# Patient Record
Sex: Female | Born: 1965 | Race: White | Hispanic: No | Marital: Married | Smoking: Never smoker
Health system: Southern US, Community
[De-identification: ages and names within clinical notes are randomized; demographics above are authoritative.]

## PROBLEM LIST (undated history)

## (undated) DIAGNOSIS — F32A Depression, unspecified: Secondary | ICD-10-CM

## (undated) DIAGNOSIS — F329 Major depressive disorder, single episode, unspecified: Secondary | ICD-10-CM

## (undated) HISTORY — PX: CARPAL TUNNEL RELEASE: SHX101

---

## 2002-05-31 ENCOUNTER — Emergency Department (HOSPITAL_COMMUNITY): Admission: EM | Admit: 2002-05-31 | Discharge: 2002-06-01 | Payer: Self-pay | Admitting: Emergency Medicine

## 2002-06-01 ENCOUNTER — Encounter: Payer: Self-pay | Admitting: Emergency Medicine

## 2004-10-19 ENCOUNTER — Emergency Department (HOSPITAL_COMMUNITY): Admission: EM | Admit: 2004-10-19 | Discharge: 2004-10-19 | Payer: Self-pay | Admitting: Family Medicine

## 2004-11-21 ENCOUNTER — Emergency Department (HOSPITAL_COMMUNITY): Admission: EM | Admit: 2004-11-21 | Discharge: 2004-11-21 | Payer: Self-pay | Admitting: Emergency Medicine

## 2005-04-06 ENCOUNTER — Encounter: Admission: RE | Admit: 2005-04-06 | Discharge: 2005-04-06 | Payer: Self-pay | Admitting: Family Medicine

## 2005-04-10 ENCOUNTER — Encounter: Admission: RE | Admit: 2005-04-10 | Discharge: 2005-04-10 | Payer: Self-pay | Admitting: Family Medicine

## 2005-04-13 ENCOUNTER — Encounter: Admission: RE | Admit: 2005-04-13 | Discharge: 2005-04-13 | Payer: Self-pay | Admitting: Family Medicine

## 2005-06-22 ENCOUNTER — Emergency Department (HOSPITAL_COMMUNITY): Admission: EM | Admit: 2005-06-22 | Discharge: 2005-06-23 | Payer: Self-pay | Admitting: Emergency Medicine

## 2005-10-24 ENCOUNTER — Emergency Department (HOSPITAL_COMMUNITY): Admission: EM | Admit: 2005-10-24 | Discharge: 2005-10-24 | Payer: Self-pay | Admitting: Family Medicine

## 2006-08-15 ENCOUNTER — Emergency Department (HOSPITAL_COMMUNITY): Admission: EM | Admit: 2006-08-15 | Discharge: 2006-08-15 | Payer: Self-pay | Admitting: Emergency Medicine

## 2006-12-17 ENCOUNTER — Other Ambulatory Visit: Admission: RE | Admit: 2006-12-17 | Discharge: 2006-12-17 | Payer: Self-pay | Admitting: Obstetrics and Gynecology

## 2006-12-17 ENCOUNTER — Ambulatory Visit (HOSPITAL_COMMUNITY): Admission: RE | Admit: 2006-12-17 | Discharge: 2006-12-17 | Payer: Self-pay | Admitting: Obstetrics and Gynecology

## 2007-02-17 ENCOUNTER — Ambulatory Visit (HOSPITAL_COMMUNITY): Admission: RE | Admit: 2007-02-17 | Discharge: 2007-02-17 | Payer: Self-pay | Admitting: Obstetrics and Gynecology

## 2007-02-28 ENCOUNTER — Ambulatory Visit (HOSPITAL_COMMUNITY): Admission: RE | Admit: 2007-02-28 | Discharge: 2007-02-28 | Payer: Self-pay | Admitting: Obstetrics and Gynecology

## 2007-03-19 ENCOUNTER — Ambulatory Visit (HOSPITAL_COMMUNITY): Admission: RE | Admit: 2007-03-19 | Discharge: 2007-03-19 | Payer: Self-pay | Admitting: Obstetrics and Gynecology

## 2007-04-16 ENCOUNTER — Ambulatory Visit (HOSPITAL_COMMUNITY): Admission: RE | Admit: 2007-04-16 | Discharge: 2007-04-16 | Payer: Self-pay | Admitting: Obstetrics and Gynecology

## 2007-05-07 ENCOUNTER — Inpatient Hospital Stay (HOSPITAL_COMMUNITY): Admission: AD | Admit: 2007-05-07 | Discharge: 2007-05-07 | Payer: Self-pay | Admitting: Obstetrics and Gynecology

## 2007-05-14 ENCOUNTER — Ambulatory Visit (HOSPITAL_COMMUNITY): Admission: RE | Admit: 2007-05-14 | Discharge: 2007-05-14 | Payer: Self-pay | Admitting: Obstetrics and Gynecology

## 2007-06-05 ENCOUNTER — Inpatient Hospital Stay (HOSPITAL_COMMUNITY): Admission: AD | Admit: 2007-06-05 | Discharge: 2007-06-05 | Payer: Self-pay | Admitting: Obstetrics and Gynecology

## 2007-06-11 ENCOUNTER — Ambulatory Visit (HOSPITAL_COMMUNITY): Admission: RE | Admit: 2007-06-11 | Discharge: 2007-06-11 | Payer: Self-pay | Admitting: Obstetrics and Gynecology

## 2007-07-13 ENCOUNTER — Inpatient Hospital Stay (HOSPITAL_COMMUNITY): Admission: AD | Admit: 2007-07-13 | Discharge: 2007-07-19 | Payer: Self-pay | Admitting: Obstetrics and Gynecology

## 2007-07-15 ENCOUNTER — Encounter (INDEPENDENT_AMBULATORY_CARE_PROVIDER_SITE_OTHER): Payer: Self-pay | Admitting: Obstetrics and Gynecology

## 2008-11-18 ENCOUNTER — Other Ambulatory Visit: Admission: RE | Admit: 2008-11-18 | Discharge: 2008-11-18 | Payer: Self-pay | Admitting: Obstetrics and Gynecology

## 2009-02-15 IMAGING — US US OB DETAIL+14 WK
1 series · 14 of 28 positions shown · non-contrast
Comparison: none

OBSTETRICAL ULTRASOUND:
 This ultrasound was performed in The [HOSPITAL], and the [facility] report will be stored to [REDACTED] PACS.

[Series 1: us ob detail+14 wk · 14 of 54 slices shown]
[im 2/54]
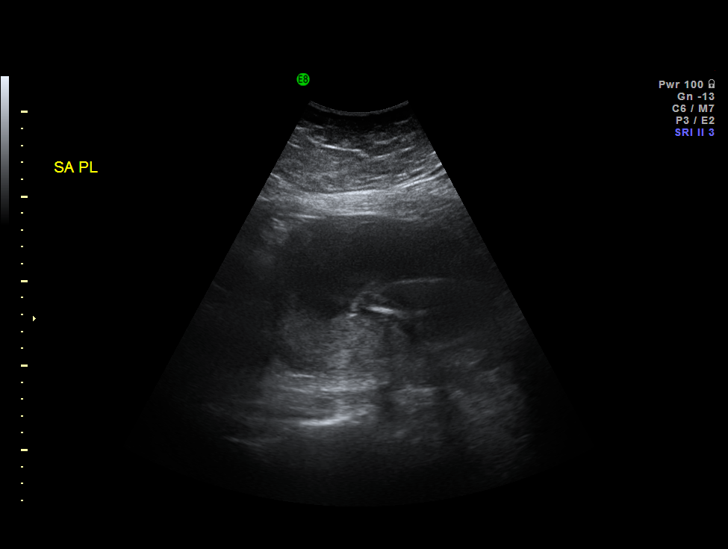
[im 6/54]
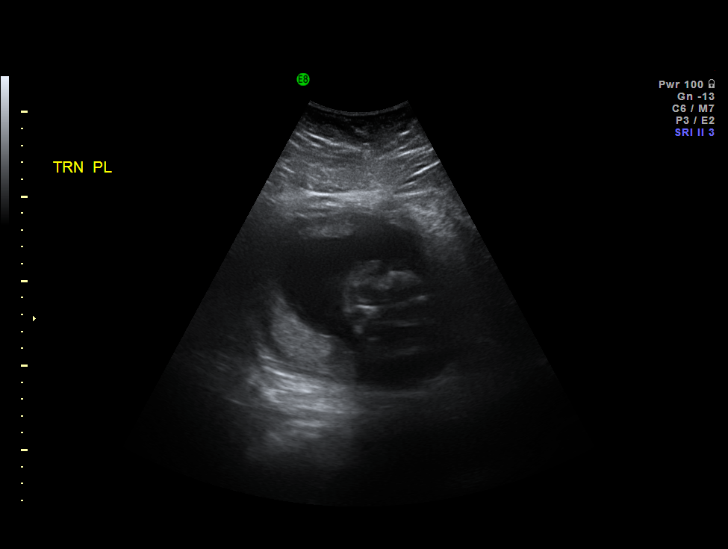
[im 10/54]
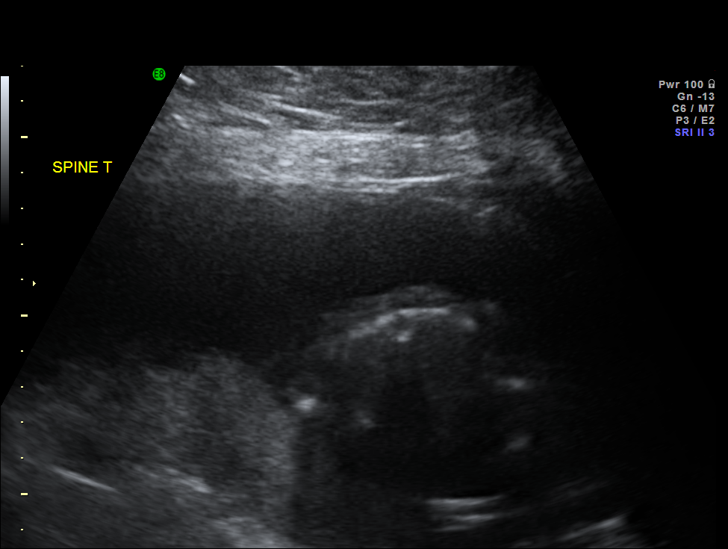
[im 14/54]
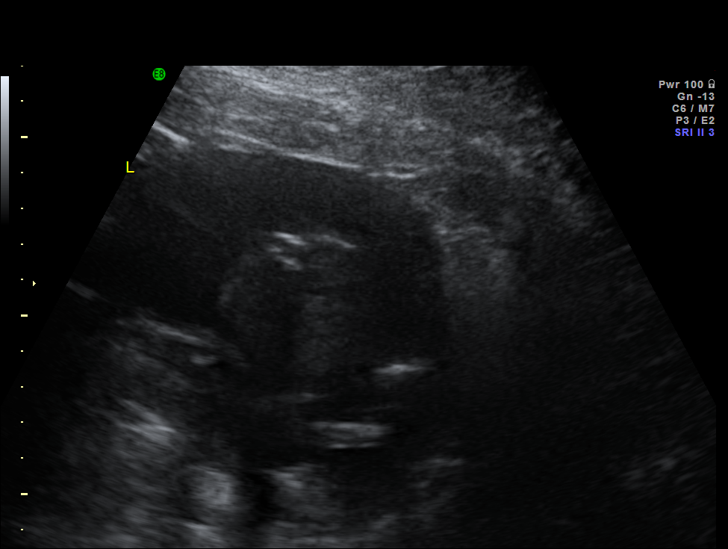
[im 18/54]
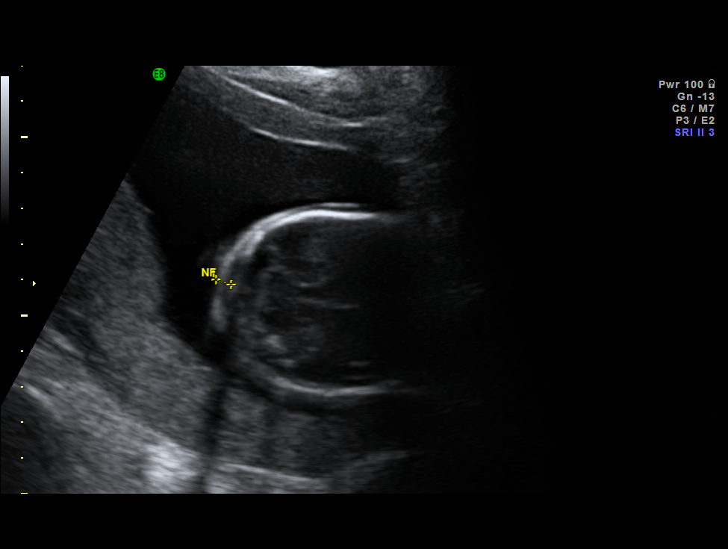
[im 22/54]
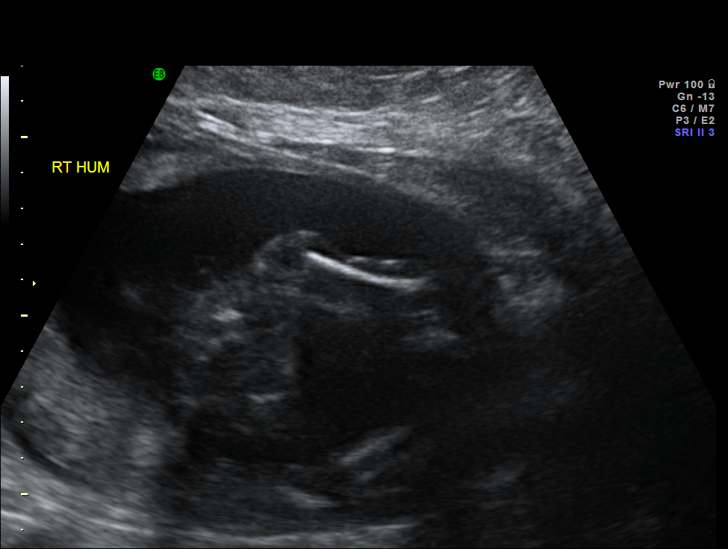
[im 26/54]
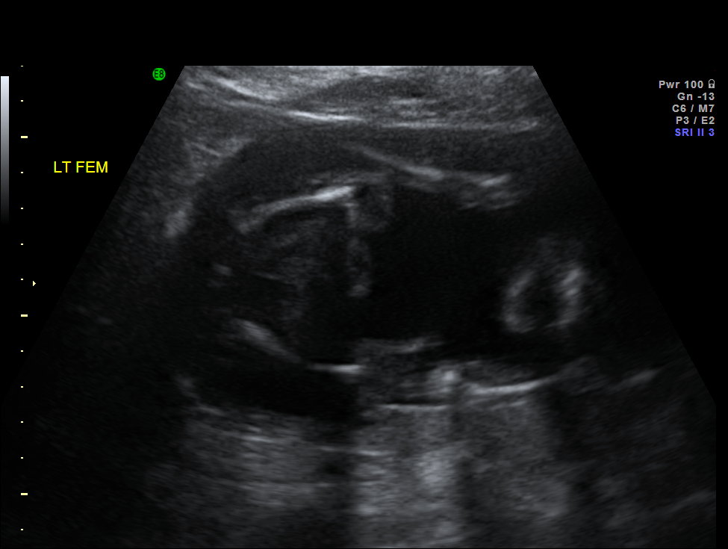
[im 30/54]
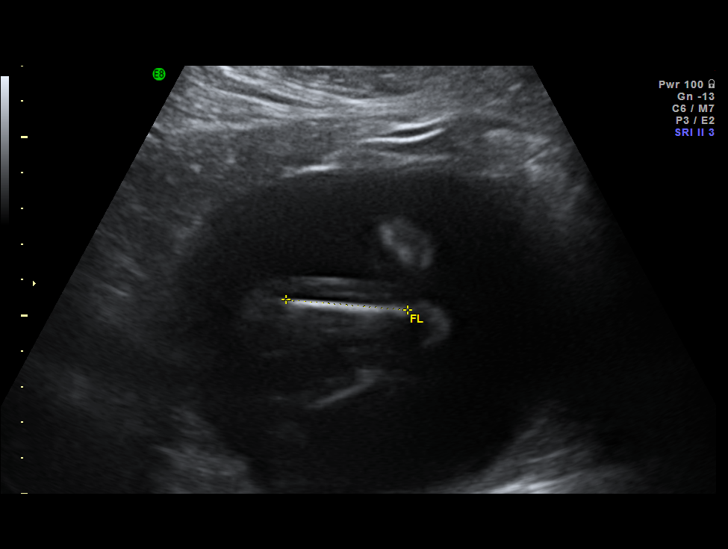
[im 34/54]
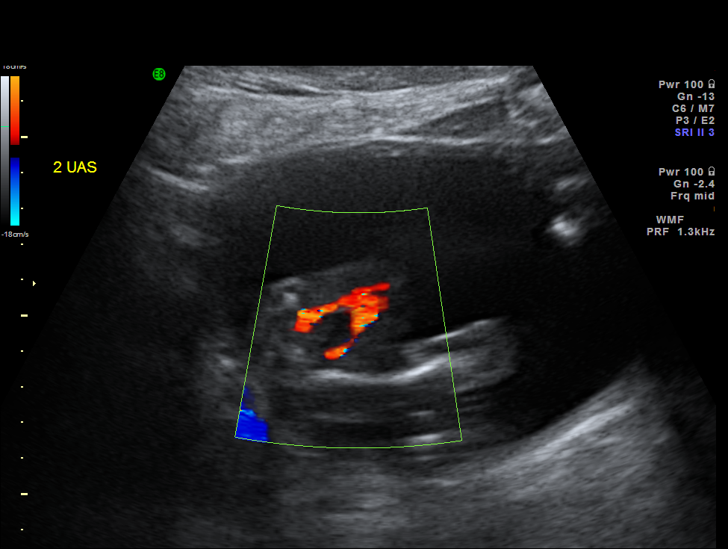
[im 38/54]
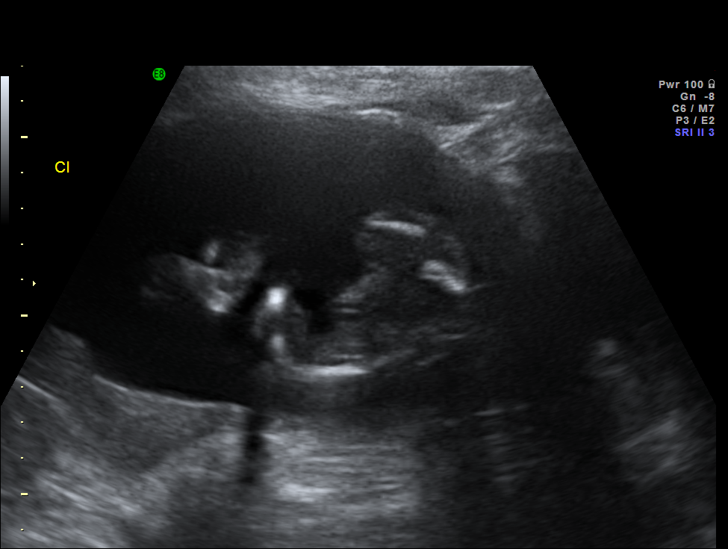
[im 42/54]
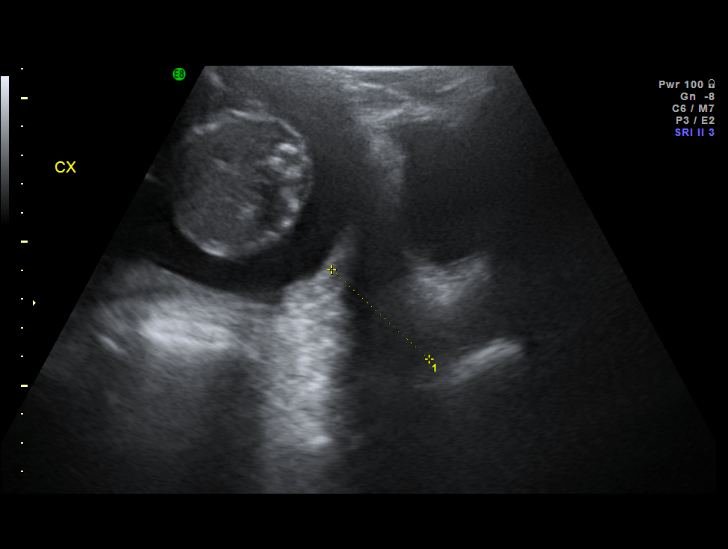
[im 46/54]
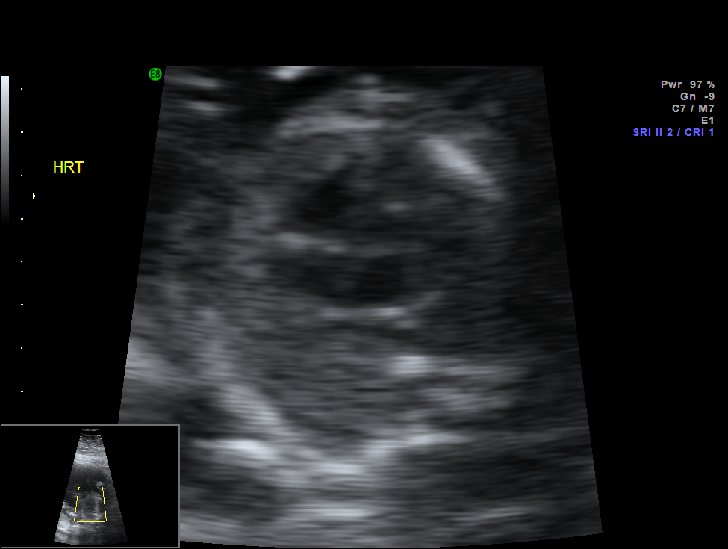
[im 50/54]
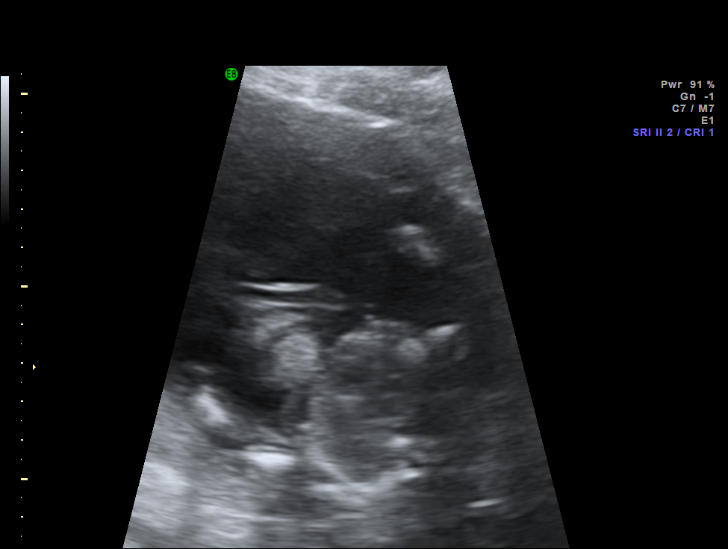
[im 54/54]
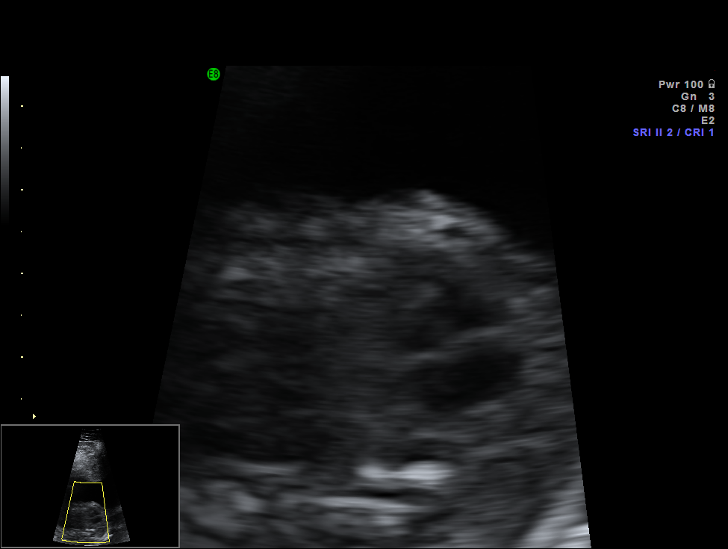

[14 of 28 positions shown; findings below may reference images not displayed]

IMPRESSION: The [facility] report has also been faxed to the ordering physician.

## 2009-05-12 IMAGING — US US OB FOLLOW-UP
1 series · 14 of 26 positions shown · non-contrast
Comparison: none

OBSTETRICAL ULTRASOUND:
 This ultrasound was performed in The [HOSP[facility]], and the [facility] report will be stored to [REDACTED] PACS.

[Series 1: us ob follow-up · 14 of 26 slices shown]
[im 1/26]
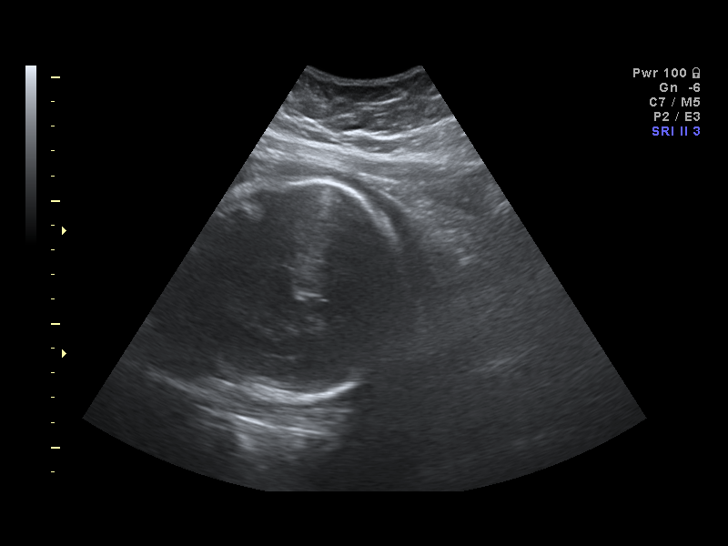
[im 3/26]
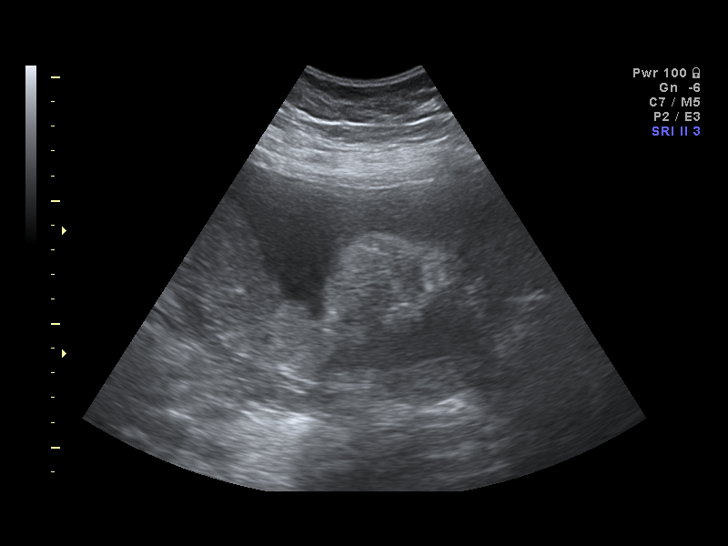
[im 5/26]
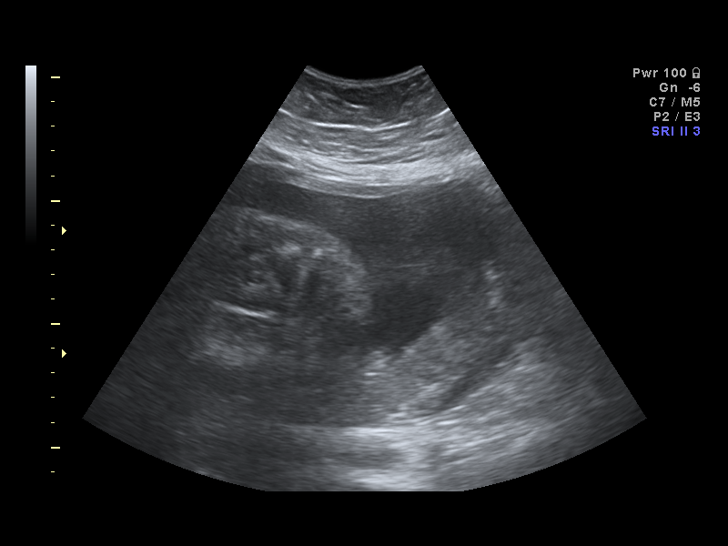
[im 7/26]
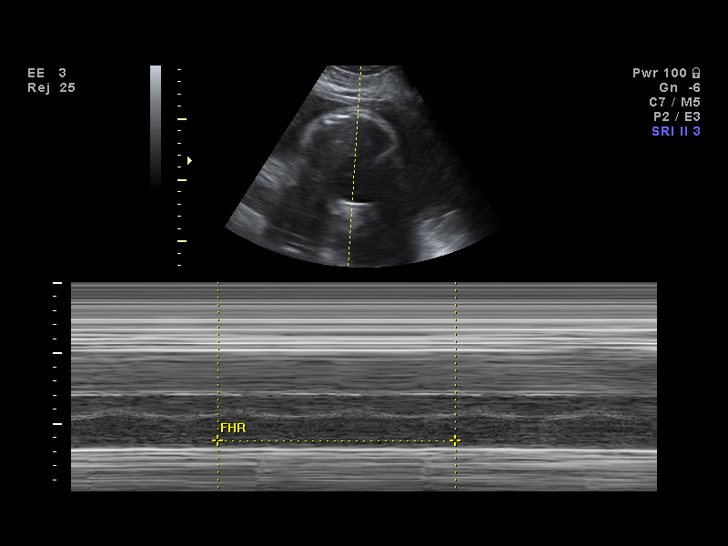
[im 9/26]
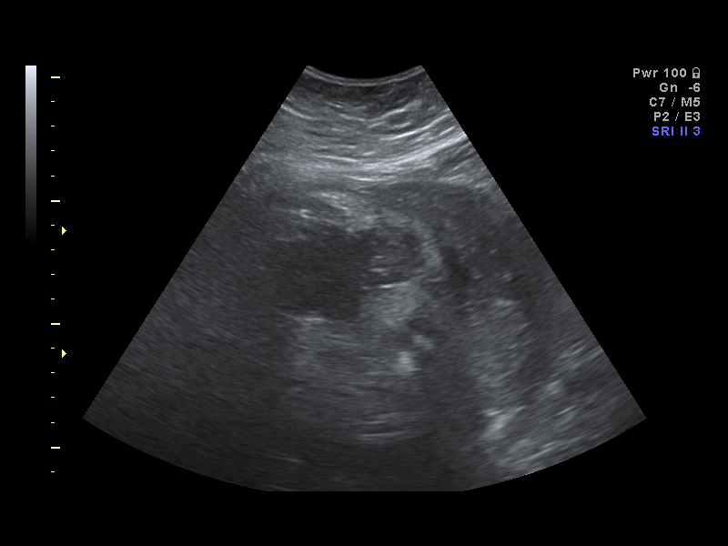
[im 11/26]
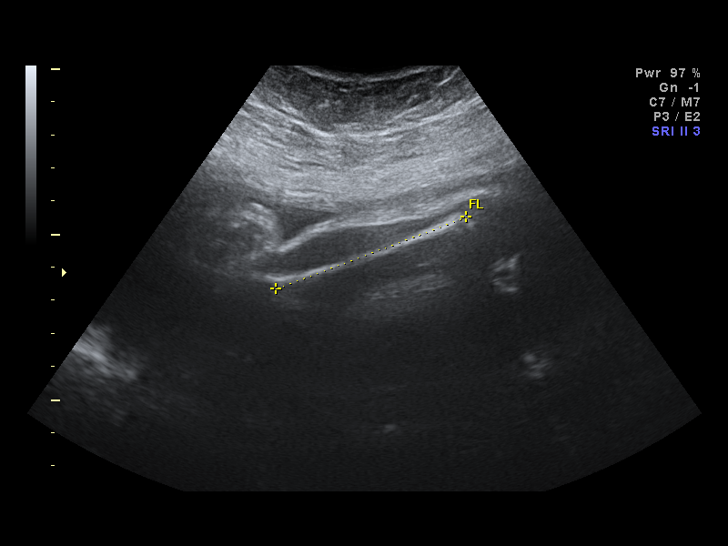
[im 13/26]
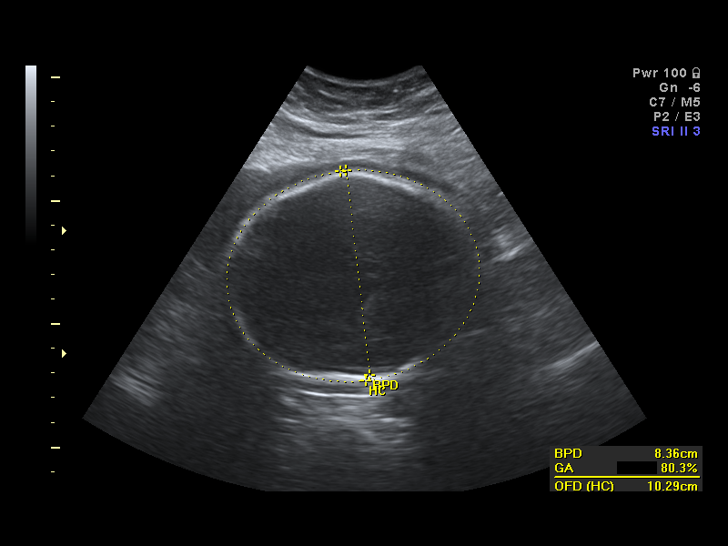
[im 14/26]
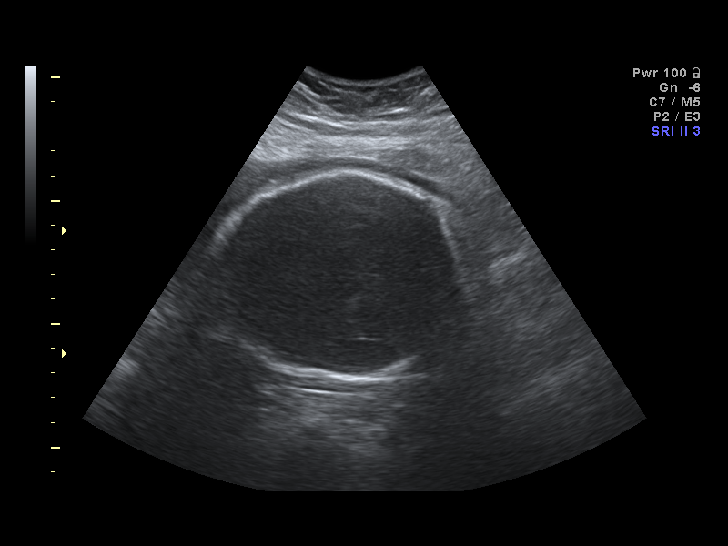
[im 16/26]
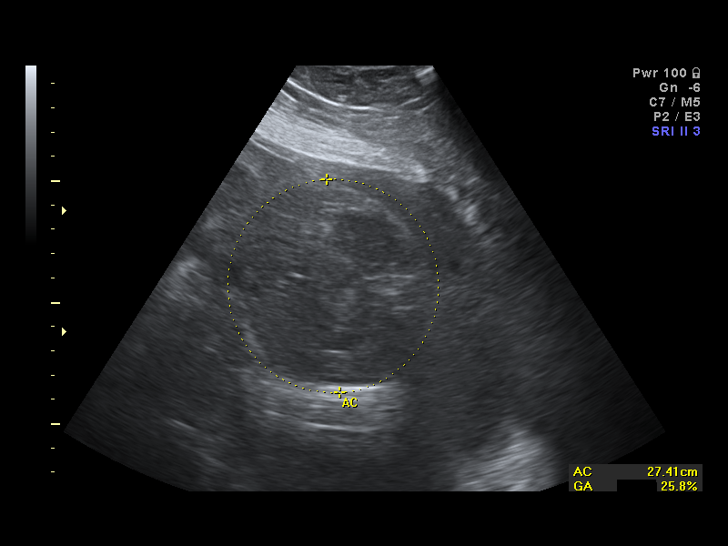
[im 18/26]
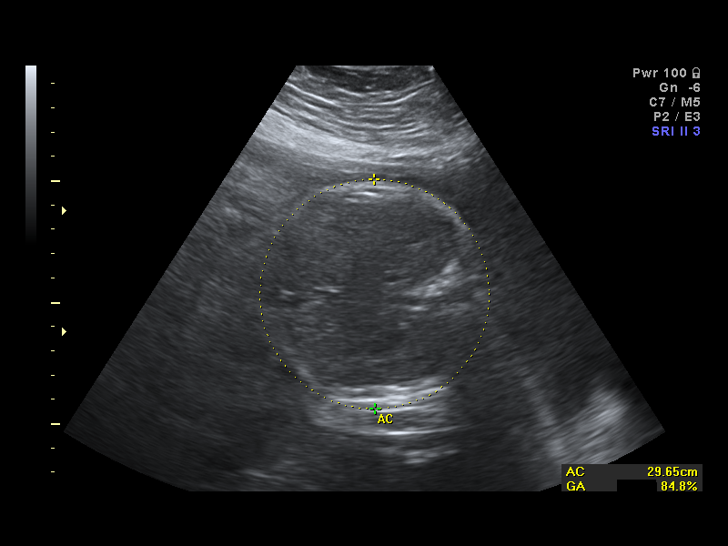
[im 20/26]
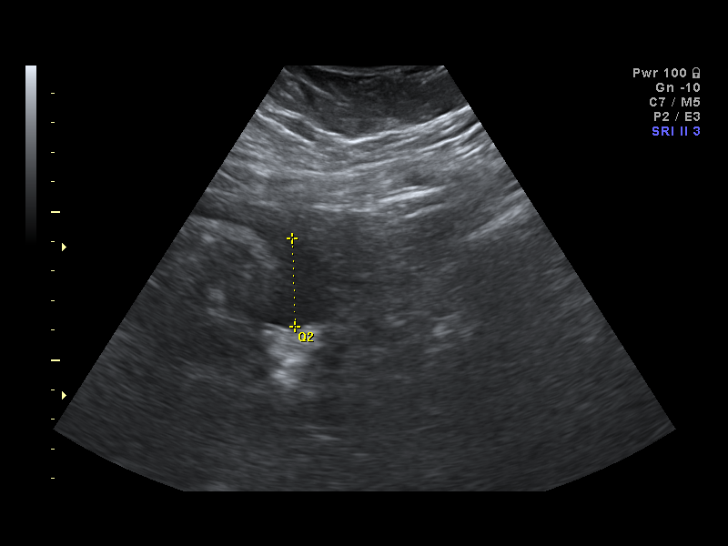
[im 22/26]
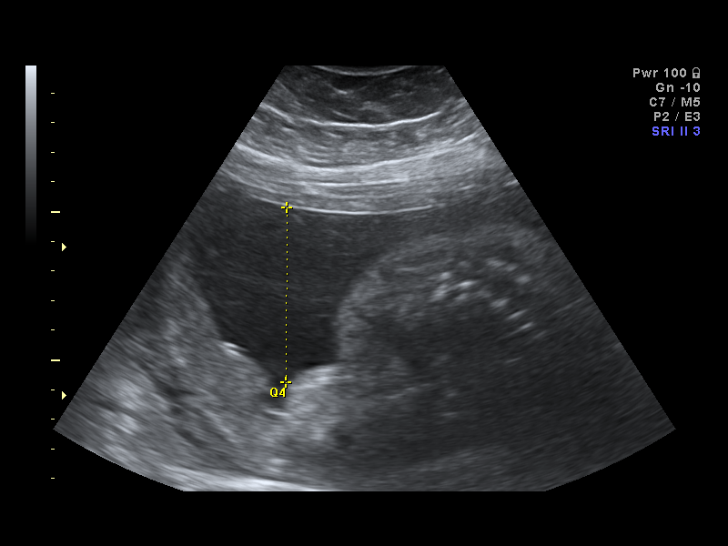
[im 24/26]
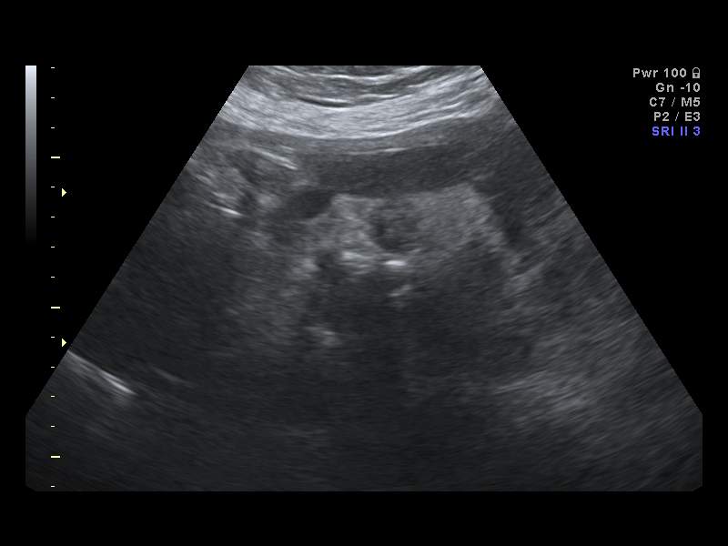
[im 26/26]
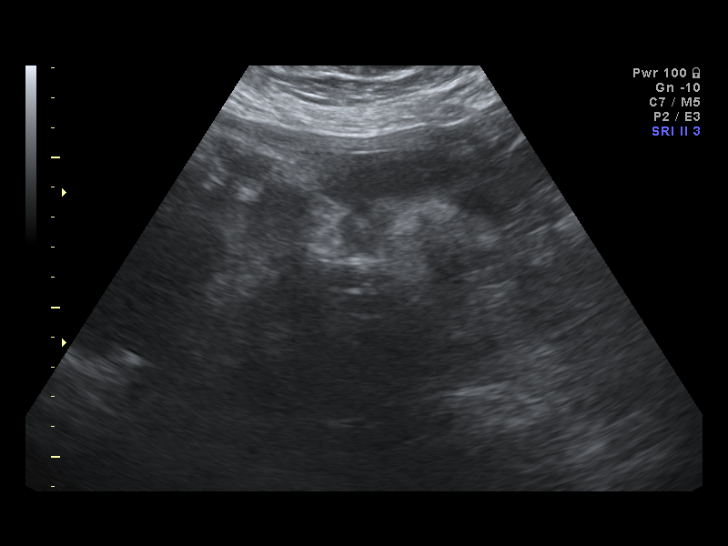

[14 of 26 positions shown; findings below may reference images not displayed]

IMPRESSION: The [facility] report has also been faxed to the ordering physician.

## 2010-01-20 ENCOUNTER — Emergency Department (HOSPITAL_COMMUNITY): Admission: EM | Admit: 2010-01-20 | Discharge: 2010-01-20 | Payer: Self-pay | Admitting: Emergency Medicine

## 2010-03-20 ENCOUNTER — Ambulatory Visit (HOSPITAL_COMMUNITY): Admission: RE | Admit: 2010-03-20 | Discharge: 2010-03-20 | Payer: Self-pay | Admitting: Family Medicine

## 2010-09-13 ENCOUNTER — Emergency Department (HOSPITAL_COMMUNITY)
Admission: EM | Admit: 2010-09-13 | Discharge: 2010-09-13 | Payer: Self-pay | Source: Home / Self Care | Admitting: Emergency Medicine

## 2010-09-24 ENCOUNTER — Encounter: Payer: Self-pay | Admitting: Family Medicine

## 2010-10-23 ENCOUNTER — Emergency Department (HOSPITAL_BASED_OUTPATIENT_CLINIC_OR_DEPARTMENT_OTHER)
Admission: EM | Admit: 2010-10-23 | Discharge: 2010-10-23 | Disposition: A | Discharge status: Home / Self Care | Payer: BC Managed Care – PPO | Attending: Emergency Medicine | Admitting: Emergency Medicine

## 2010-10-23 ENCOUNTER — Emergency Department (INDEPENDENT_AMBULATORY_CARE_PROVIDER_SITE_OTHER): Payer: BC Managed Care – PPO

## 2010-10-23 DIAGNOSIS — R1011 Right upper quadrant pain: Secondary | ICD-10-CM

## 2010-10-23 DIAGNOSIS — R1013 Epigastric pain: Secondary | ICD-10-CM | POA: Insufficient documentation

## 2010-10-23 DIAGNOSIS — R110 Nausea: Secondary | ICD-10-CM | POA: Insufficient documentation

## 2010-10-23 LAB — DIFFERENTIAL
Basophils Relative: 0 % (ref 0–1)
Lymphs Abs: 3.5 10*3/uL (ref 0.7–4.0)
Neutro Abs: 4.8 10*3/uL (ref 1.7–7.7)
Neutrophils Relative %: 53 % (ref 43–77)

## 2010-10-23 LAB — CBC
HCT: 39.2 % (ref 36.0–46.0)
Hemoglobin: 13.3 g/dL (ref 12.0–15.0)
MCH: 27.6 pg (ref 26.0–34.0)
MCHC: 33.9 g/dL (ref 30.0–36.0)
MCV: 81.3 fL (ref 78.0–100.0)
RBC: 4.82 MIL/uL (ref 3.87–5.11)
WBC: 9.1 10*3/uL (ref 4.0–10.5)

## 2010-10-23 LAB — COMPREHENSIVE METABOLIC PANEL
AST: 27 U/L (ref 0–37)
CO2: 27 mEq/L (ref 19–32)
Calcium: 9.2 mg/dL (ref 8.4–10.5)
GFR calc Af Amer: >60 mL/min (ref >60)
Glucose, Bld: 142 mg/dL — ABNORMAL HIGH (ref 70–99)
Total Protein: 7.7 g/dL (ref 6.0–8.3)

## 2010-10-23 LAB — URINE MICROSCOPIC-ADD ON

## 2010-10-23 LAB — URINALYSIS, ROUTINE W REFLEX MICROSCOPIC
Bilirubin Urine: NEGATIVE
Ketones, ur: NEGATIVE mg/dL
Nitrite: NEGATIVE
Specific Gravity, Urine: 1.025 (ref 1.005–1.030)
Urine Glucose, Fasting: NEGATIVE mg/dL
pH: 5.5 (ref 5.0–8.0)

## 2010-10-23 LAB — LIPASE, BLOOD: Lipase: 72 U/L (ref 23–300)

## 2010-11-20 LAB — URINE MICROSCOPIC-ADD ON

## 2010-11-20 LAB — COMPREHENSIVE METABOLIC PANEL
Calcium: 9 mg/dL (ref 8.4–10.5)
Chloride: 109 mEq/L (ref 96–112)
GFR calc Af Amer: >60 mL/min (ref >60)
Total Protein: 7.4 g/dL (ref 6.0–8.3)

## 2010-11-20 LAB — URINE CULTURE

## 2010-11-20 LAB — URINALYSIS, ROUTINE W REFLEX MICROSCOPIC
Nitrite: NEGATIVE
Specific Gravity, Urine: 1.017 (ref 1.005–1.030)
Urobilinogen, UA: 0.2 mg/dL (ref 0.0–1.0)

## 2010-11-20 LAB — LIPASE, BLOOD: Lipase: 26 U/L (ref 11–59)

## 2010-11-20 LAB — CBC
HCT: 37.6 % (ref 36.0–46.0)
Hemoglobin: 12.6 g/dL (ref 12.0–15.0)
MCHC: 33.6 g/dL (ref 30.0–36.0)
RBC: 4.48 MIL/uL (ref 3.87–5.11)
RDW: 14.2 % (ref 11.5–15.5)

## 2010-11-20 LAB — WET PREP, GENITAL

## 2010-11-20 LAB — GC/CHLAMYDIA PROBE AMP, GENITAL
Chlamydia, DNA Probe: NEGATIVE
GC Probe Amp, Genital: NEGATIVE

## 2010-11-20 LAB — DIFFERENTIAL
Eosinophils Absolute: 0.4 10*3/uL (ref 0.0–0.7)
Lymphs Abs: 3.1 10*3/uL (ref 0.7–4.0)
Neutro Abs: 4.1 10*3/uL (ref 1.7–7.7)
Neutrophils Relative %: 49 % (ref 43–77)

## 2010-11-20 LAB — RAPID STREP SCREEN (MED CTR MEBANE ONLY): Streptococcus, Group A Screen (Direct): NEGATIVE

## 2010-11-20 LAB — PREGNANCY, URINE: Preg Test, Ur: NEGATIVE

## 2011-01-16 NOTE — Op Note (Signed)
NAMELIBERTY, [name]               ACCOUNT NO.:  [id number]   MEDICAL RECORD NO.:  [id number]          PATIENT TYPE:  INP   LOCATION:  9127                          FACILITY:  [facility]   PHYSICIAN:  Gerald Leitz, MD          DATE OF BIRTH:  Dec 31, 1965   DATE OF PROCEDURE:  07/15/2007  DATE OF DISCHARGE:                               OPERATIVE REPORT   PREOPERATIVE DIAGNOSES:  1. 41-week intrauterine pregnancy.  2. Nonreassuring fetal heart rate.  3. Advanced maternal age.  4. Desires permanent sterilization.   POSTOPERATIVE DIAGNOSES:  1. 41-week intrauterine pregnancy.  2. Nonreassuring fetal heart rate.  3. Advanced maternal age.  4. Status post bilateral tubal ligation.   PROCEDURE:  Primary low transverse cesarean section and bilateral tubal  ligation.   SURGEON:  Dr. Gerald Leitz   ASSISTANT:  Bing Neighbors. Delcambre, MD   ANESTHESIA:  Spinal.   SPECIMENS SENT/DISPOSITION:  Portion of the right and left fallopian  tube as well as placenta and uterus sent to pathology.   ESTIMATED BLOOD LOSS:  400 mL.   COMPLICATIONS:  None.   FINDINGS:  Female infant, cephalic presentation, nuchal cord x2, Apgar 6  and 7 at one and five minutes respectively.   INDICATIONS:  This a 41-week intrauterine pregnancy induced for post  dates, developed late decelerations, recommend cesarean section.   PROCEDURE:  Risks, benefits and alternatives were discussed with the  patient.  She was taken to the operating room where spinal anesthesia  was placed.  She was prepped and draped in the usual sterile fashion.  Pfannenstiel incision was made with a scalpel, carried down to the  underlying layer of fascia.  The fascia was incised in the midline.  The  incision was extended laterally with Mayo scissors.  The superior aspect  of the fascial incision was grasped with Kocher clamps, elevated.  Underlying rectus muscles dissected off with Mayo scissors and this was  repeated on the inferior aspect of the  fascial incision.  The rectus  muscle was separated in the midline.  The peritoneum was identified and  entered sharply with Metzenbaum scissors.  The incision was extended  inferiorly and superiorly with good visualization of the bladder.  Alexis retractor was placed into the peritoneal cavity.  The  vesicouterine peritoneum was identified, tented up and entered sharply  with Metzenbaum scissors.  This incision was extended laterally with  Metzenbaum scissors.  The lower uterine segment was incised with  scalpel.  The infant's head was delivered atraumatically.  Nuchal cord  x2 was reduced, cord was clamped x2 and cut and infant was handed off to  the waiting neonatology team.  The arterial cord blood was obtained and  sent to laboratory.  Arterial pH was 7.25, the placenta was expressed.  Uterus was exteriorized and cleared of all clot and debris.  Uterine  incision was repaired with 0 Vicryl in a running locked fashion.  Second  layer of same suture was used for excellent hemostasis.  The uterus was  noted to be atonic at this point.  0.2  mg of Methergine were given IM by  anesthesiologist.  Uterine atony resolved.  Attention was turned to the  left fallopian tube which was grasped with a Babcock clamp.  The  mesosalpinx was separated from the fallopian tube with electrocautery.  The fallopian tube was suture ligated with 0 plain gut.  2 cm portion of  fallopian tube was then excised with Metzenbaum scissors and sent to  pathology.  This was repeated on the right fallopian tube.  The uterus  was then returned to the abdomen.  The patient was noted to have some  bleeding from the uterine incision.  This was repaired with interrupted  stitch of 0 Vicryl.  Excellent hemostasis was noted.  The  Alexis  retractor was removed.  The fascia was reapproximated with 0 PDS,  subcutaneous tissue was reapproximated with 2-0 plain suture.  The skin  was reapproximated with 4-0 Vicryl.  Sponge, lap,  needle counts were  correct x2.  2 grams of Ancef were given at cord clamp.      Gerald Leitz, MD  Electronically Signed     TC/MEDQ  D:  07/15/2007  T:  07/16/2007  Job:  [id number]

## 2011-01-19 NOTE — Discharge Summary (Signed)
[name], [name]               ACCOUNT NO.:  [id number]   MEDICAL RECORD NO.:  [id number]          PATIENT TYPE:  INP   LOCATION:  9127                          FACILITY:  [facility]   PHYSICIAN:  Gerald Leitz, MD          DATE OF BIRTH:  Nov 16, 1965   DATE OF ADMISSION:  07/13/2007  DATE OF DISCHARGE:  07/19/2007                               DISCHARGE SUMMARY   INDICATION FOR ADMISSION:  1. Forty-one-week intrauterine pregnancy.  2. Post date induction.   DISCHARGE DIAGNOSES:  1. Forty-one-week intrauterine pregnancy.  2. Post date induction.  3. Low transverse cesarean section.  4. Nonreassuring fetal heart rate.   BRIEF HOSPITAL COURSE:  The patient was admitted on July 13, 2007,  for Cervidil induction.  On November 11 the patient developed  nonreassuring fetal testing and underwent cesarean section.  She  delivered a live-born female infant with Apgar scores of 6 and 7 at one  and five minutes respectively.  The patient did well postoperatively.  On postoperative day #1 her hemoglobin was 10.7.  She was discharged  home on postoperative day #4 in stable condition on the following  medications:  Prozac, Percocet, Motrin.  She will follow up in 6 weeks  for postpartum visit.   CONDITION ON DISCHARGE:  Stable.   ACTIVITY:  Pelvic rest x6 weeks.   DIET:  Regular.      Gerald Leitz, MD  Electronically Signed     TC/MEDQ  D:  08/12/2007  T:  08/12/2007  Job:  [id number]

## 2011-06-12 LAB — CBC
HCT: 37.1
Hemoglobin: 10.7 — ABNORMAL LOW
Hemoglobin: 12.8
MCHC: 34.7
MCV: 87.4
RBC: 3.54 — ABNORMAL LOW
WBC: 11.1 — ABNORMAL HIGH

## 2011-06-14 LAB — URINALYSIS, ROUTINE W REFLEX MICROSCOPIC
Glucose, UA: NEGATIVE
Hgb urine dipstick: NEGATIVE
Ketones, ur: NEGATIVE
Protein, ur: NEGATIVE

## 2011-06-14 LAB — COMPREHENSIVE METABOLIC PANEL
AST: 20
CO2: 25
Chloride: 103
Creatinine, Ser: 0.5
GFR calc Af Amer: >60

## 2011-06-15 LAB — WET PREP, GENITAL
Trich, Wet Prep: NONE SEEN
Yeast Wet Prep HPF POC: NONE SEEN

## 2013-09-18 ENCOUNTER — Emergency Department (HOSPITAL_BASED_OUTPATIENT_CLINIC_OR_DEPARTMENT_OTHER): Payer: BC Managed Care – PPO

## 2013-09-18 ENCOUNTER — Encounter (HOSPITAL_BASED_OUTPATIENT_CLINIC_OR_DEPARTMENT_OTHER): Payer: Self-pay | Admitting: Emergency Medicine

## 2013-09-18 ENCOUNTER — Emergency Department (HOSPITAL_BASED_OUTPATIENT_CLINIC_OR_DEPARTMENT_OTHER)
Admission: EM | Admit: 2013-09-18 | Discharge: 2013-09-18 | Disposition: A | Discharge status: Home / Self Care | Payer: BC Managed Care – PPO | Attending: Emergency Medicine | Admitting: Emergency Medicine

## 2013-09-18 DIAGNOSIS — S62102A Fracture of unspecified carpal bone, left wrist, initial encounter for closed fracture: Secondary | ICD-10-CM

## 2013-09-18 DIAGNOSIS — Z9889 Other specified postprocedural states: Secondary | ICD-10-CM | POA: Insufficient documentation

## 2013-09-18 DIAGNOSIS — Y9239 Other specified sports and athletic area as the place of occurrence of the external cause: Secondary | ICD-10-CM | POA: Insufficient documentation

## 2013-09-18 DIAGNOSIS — Y9319 Activity, other involving water and watercraft: Secondary | ICD-10-CM | POA: Insufficient documentation

## 2013-09-18 DIAGNOSIS — S52509A Unspecified fracture of the lower end of unspecified radius, initial encounter for closed fracture: Secondary | ICD-10-CM | POA: Insufficient documentation

## 2013-09-18 DIAGNOSIS — Y92838 Other recreation area as the place of occurrence of the external cause: Secondary | ICD-10-CM

## 2013-09-18 DIAGNOSIS — W010XXA Fall on same level from slipping, tripping and stumbling without subsequent striking against object, initial encounter: Secondary | ICD-10-CM | POA: Insufficient documentation

## 2013-09-18 DIAGNOSIS — S52609A Unspecified fracture of lower end of unspecified ulna, initial encounter for closed fracture: Principal | ICD-10-CM

## 2013-09-18 DIAGNOSIS — Z8781 Personal history of (healed) traumatic fracture: Secondary | ICD-10-CM | POA: Insufficient documentation

## 2013-09-18 LAB — BASIC METABOLIC PANEL
BUN: 9 mg/dL (ref 6–23)
CHLORIDE: 103 meq/L (ref 96–112)
CO2: 22 meq/L (ref 19–32)
CREATININE: 0.6 mg/dL (ref 0.50–1.10)
Calcium: 9 mg/dL (ref 8.4–10.5)
GFR calc non Af Amer: >90 mL/min (ref >90)
GLUCOSE: 126 mg/dL — AB (ref 70–99)
Potassium: 3.7 mEq/L (ref 3.7–5.3)
Sodium: 140 mEq/L (ref 137–147)

## 2013-09-18 LAB — CBC WITH DIFFERENTIAL/PLATELET
BASOS ABS: 0 10*3/uL (ref 0.0–0.1)
BASOS PCT: 0 % (ref 0–1)
EOS ABS: 0.1 10*3/uL (ref 0.0–0.7)
EOS PCT: 1 % (ref 0–5)
HEMATOCRIT: 37.1 % (ref 36.0–46.0)
Hemoglobin: 12.3 g/dL (ref 12.0–15.0)
Lymphocytes Relative: 21 % (ref 12–46)
Lymphs Abs: 2.7 10*3/uL (ref 0.7–4.0)
MCH: 28.2 pg (ref 26.0–34.0)
MCHC: 33.2 g/dL (ref 30.0–36.0)
MCV: 85.1 fL (ref 78.0–100.0)
MONO ABS: 0.9 10*3/uL (ref 0.1–1.0)
MONOS PCT: 7 % (ref 3–12)
Neutro Abs: 9.3 10*3/uL — ABNORMAL HIGH (ref 1.7–7.7)
Neutrophils Relative %: 72 % (ref 43–77)
Platelets: 376 10*3/uL (ref 150–400)
RBC: 4.36 MIL/uL (ref 3.87–5.11)
RDW: 13.5 % (ref 11.5–15.5)
WBC: 13 10*3/uL — ABNORMAL HIGH (ref 4.0–10.5)

## 2013-09-18 MED ORDER — ONDANSETRON HCL 4 MG/2ML IJ SOLN
4.0000 mg | Freq: Once | INTRAMUSCULAR | Status: AC
  Administered 2013-09-18: 4 mg via INTRAVENOUS

## 2013-09-18 MED ORDER — FENTANYL CITRATE 0.05 MG/ML IJ SOLN
100.0000 ug | Freq: Once | INTRAMUSCULAR | Status: AC
  Administered 2013-09-18: 100 ug via INTRAVENOUS

## 2013-09-18 MED ORDER — OXYCODONE-ACETAMINOPHEN 7.5-325 MG PO TABS: 1.0000 | ORAL_TABLET | ORAL | Status: DC | PRN

## 2013-09-18 MED ORDER — ONDANSETRON HCL 4 MG/2ML IJ SOLN
INTRAMUSCULAR | Status: AC
  Administered 2013-09-18: 4 mg via INTRAVENOUS
  Filled 2013-09-18: qty 2

## 2013-09-18 MED ORDER — HYDROMORPHONE HCL PF 1 MG/ML IJ SOLN
1.0000 mg | Freq: Once | INTRAMUSCULAR | Status: AC
  Administered 2013-09-18: 1 mg via INTRAVENOUS
  Filled 2013-09-18: qty 1

## 2013-09-18 MED ORDER — FENTANYL CITRATE 0.05 MG/ML IJ SOLN
INTRAMUSCULAR | Status: AC
  Administered 2013-09-18: 100 ug via INTRAVENOUS
  Filled 2013-09-18: qty 2

## 2013-09-18 NOTE — ED Notes (Signed)
EKG being done for pre op  Surgery on Sat.  09-19-13.

## 2013-09-18 NOTE — Discharge Instructions (Signed)
Cast or Splint Care °Casts and splints support injured limbs and keep bones from moving while they heal. It is important to care for your cast or splint at home.   °HOME CARE INSTRUCTIONS °· Keep the cast or splint uncovered during the drying period. It can take 24 to 48 hours to dry if it is made of plaster. A fiberglass cast will dry in less than 1 hour. °· Do not rest the cast on anything harder than a pillow for the first 24 hours. °· Do not put weight on your injured limb or apply pressure to the cast until your health care provider gives you permission. °· Keep the cast or splint dry. Wet casts or splints can lose their shape and may not support the limb as well. A wet cast that has lost its shape can also create harmful pressure on your skin when it dries. Also, wet skin can become infected. °· Cover the cast or splint with a plastic bag when bathing or when out in the rain or snow. If the cast is on the trunk of the body, take sponge baths until the cast is removed. °· If your cast does become wet, dry it with a towel or a blow dryer on the cool setting only. °· Keep your cast or splint clean. Soiled casts may be wiped with a moistened cloth. °· Do not place any hard or soft foreign objects under your cast or splint, such as cotton, toilet paper, lotion, or powder. °· Do not try to scratch the skin under the cast with any object. The object could get stuck inside the cast. Also, scratching could lead to an infection. If itching is a problem, use a blow dryer on a cool setting to relieve discomfort. °· Do not trim or cut your cast or remove padding from inside of it. °· Exercise all joints next to the injury that are not immobilized by the cast or splint. For example, if you have a long leg cast, exercise the hip joint and toes. If you have an arm cast or splint, exercise the shoulder, elbow, thumb, and fingers. °· Elevate your injured arm or leg on 1 or 2 pillows for the first 1 to 3 days to decrease  swelling and pain. It is best if you can comfortably elevate your cast so it is higher than your heart. °SEEK MEDICAL CARE IF:  °· Your cast or splint cracks. °· Your cast or splint is too tight or too loose. °· You have unbearable itching inside the cast. °· Your cast becomes wet or develops a soft spot or area. °· You have a bad smell coming from inside your cast. °· You get an object stuck under your cast. °· Your skin around the cast becomes red or raw. °· You have new pain or worsening pain after the cast has been applied. °SEEK IMMEDIATE MEDICAL CARE IF:  °· You have fluid leaking through the cast. °· You are unable to move your fingers or toes. °· You have discolored (blue or white), cool, painful, or very swollen fingers or toes beyond the cast. °· You have tingling or numbness around the injured area. °· You have severe pain or pressure under the cast. °· You have any difficulty with your breathing or have shortness of breath. °· You have chest pain. °Document Released: 08/17/2000 Document Revised: 06/10/2013 Document Reviewed: 02/26/2013 °[employer]® Patient Information ©2014 [employer]. ° °Wrist Fracture °A wrist fracture is a break in one of the bones of   the wrist. Your wrist is made up of several small bones at the palm of your hand (carpal bones) and the two bones that make up your forearm (radius and ulna). The bones come together to form multiple large and small joints. The shape and design of these joints allow your wrist to bend and straighten, move side-to-side, and rotate, as in twisting your palm up or down. °CAUSES  °A fracture may occur in any of the bones in your wrist when enough force is applied to the wrist, such as when falling down onto an outstretched hand. Severe injuries may occur from a more forceful injury. °SYMPTOMS °Symptoms of wrist fractures include tenderness, bruising, and swelling. Additionally, the wrist may hang in an odd position or may be misshaped. °DIAGNOSIS °To  diagnose a wrist fracture, your caregiver will physically examine your wrist. Your caregiver may also request an X-ray exam of your wrist. °TREATMENT °Treatment depends on many factors, including the nature and location of the fracture, your age, and your activity level. Treatment for wrist fracture can be nonsurgical or surgical. °For nonsurgical treatment, a plaster cast or splint may be applied to your wrist if the bone is in a good position (aligned). The cast will stay on for about 6 weeks. If the alignment of your bone is not good, it may be necessary to realign (reduce) it. After the bone is reduced, a splint usually is placed on your wrist to allow for a small amount of normal swelling. After about 1 week, the splint is removed and a cast is added. The cast is removed 2 or 3 weeks later, after the swelling goes down, causing the cast to loosen. Another cast is applied. This cast is removed after about another 2 or 3 weeks, for a total of 4 to 6 weeks of immobilization. °Sometimes the position of the bone is so far out of place that surgery is required to apply a device to hold it together as it heals. If the bone cannot be reduced without cutting the skin around the bone (closed reduction), a cut (incision) is made to allow direct access to the bone to reduce it (open reduction). Depending on the fracture, there are a number of options for holding the bone in place while it heals, including a cast, metal pins, a plate and screws, and a device called an external fixator. With an external fixator, most of the hardware remains outside of the body. °HOME CARE INSTRUCTIONS °· To lessen swelling, keep your injured wrist elevated and move your fingers as much as possible. °· Apply ice to your wrist for the first 1 to 2 days after you have been treated or as directed by your caregiver. Applying ice helps to reduce inflammation and pain. °· Put ice in a plastic bag. °· Place a towel between your skin and the  bag. °· Leave the ice on for 15 to 20 minutes at a time, every 2 hours while you are awake. °· Do not put pressure on any part of your cast or splint. It may break. °· Use a plastic bag to protect your cast or splint from water while bathing or showering. Do not lower your cast or splint into water. °· Only take over-the-counter or prescription medicines for pain as directed by your caregiver. °SEEK IMMEDIATE MEDICAL CARE IF:  °· Your cast or splint gets damaged or breaks. °· You have continued severe pain or more swelling than you did before the cast was put on. °·   Your skin or fingernails below the injury turn blue or gray or feel cold or numb. °· You develop decreased feeling in your fingers. °MAKE SURE YOU: °· Understand these instructions. °· Will watch your condition. °· Will get help right away if you are not doing well or get worse. °Document Released: 05/30/2005 Document Revised: 11/12/2011 Document Reviewed: 09/07/2011 °ExitCare® Patient Information ©2014 [employer]. ° °

## 2013-09-18 NOTE — ED Provider Notes (Signed)
CSN: [id number]     Arrival date & time 09/18/13  1924 History   This chart was scribed for Kristen Shiobert L Talayah Picardi, MD by Blanchard KelchNicole Curnes, ED Scribe. The patient was seen in room MH05/MH05. Patient's care was started at 9:20 PM.      Chief Complaint  Patient presents with  . Arm Injury    Patient is a 48 y.o. female presenting with arm injury. The history is provided by the patient. No language interpreter was used.  Arm Injury   HPI Comments: [name] is a 48 y.o. female who presents to the Emergency Department complaining of a left wrist injury that occurred just prior to arrival when she tripped on a chair in an auditorium and fell, injuring her left wrist. She is complaining of constant pain to the associated area onset immediately after the injury occurred. She has associated swelling and bruising to the affected area. She placed it in a cardboard splint and placed ice on it with mild relief. She has a past history of two left wrist fractures, last in 2005. It [facility]. She denies ever having surgery on it before.   History reviewed. No pertinent past medical history. Past Surgical History  Procedure Laterality Date  . Carpal tunnel release    . Cesarean section     No family history on file. History  Substance Use Topics  . Smoking status: Never Smoker   . Smokeless tobacco: Not on file  . Alcohol Use: No   OB History   Grav Para Term Preterm Abortions TAB SAB Ect Mult Living                 Review of Systems A complete 10 system review of systems was obtained and all systems are negative except as noted in the HPI and PMH.    Allergies  Review of patient's allergies indicates no known allergies.  Home Medications   Current Outpatient Rx  Name  Route  Sig  Dispense  Refill  . oxyCODONE-acetaminophen (PERCOCET) 7.5-325 MG per tablet   Oral   Take 1 tablet by mouth every 4 (four) hours as needed for pain.   30  tablet   0    Triage Vitals: BP 129/60  Pulse 88  Temp(Src) 98 F (36.7 C) (Oral)  Resp 20  Ht 5' 7.5" (1.715 m)  Wt 250 lb (113.399 kg)  BMI 38.56 kg/m2  SpO2 98%  LMP 08/21/2013  Physical Exam  Nursing note and vitals reviewed. Constitutional: She is oriented to person, place, and time. She appears well-developed and well-nourished. No distress.  HENT:  Head: Normocephalic and atraumatic.  Eyes: Pupils are equal, round, and reactive to light.  Neck: Normal range of motion.  Cardiovascular: Normal rate and intact distal pulses.   Pulmonary/Chest: No respiratory distress.  Abdominal: Normal appearance. She exhibits no distension.  Musculoskeletal:       Left wrist: She exhibits decreased range of motion, bony tenderness, crepitus and deformity.  Neurological: She is alert and oriented to person, place, and time. No cranial nerve deficit.  Skin: Skin is warm and dry. No rash noted.  Psychiatric: She has a normal mood and affect. Her behavior is normal.    ED Course  Procedures (including critical care time)  DIAGNOSTIC STUDIES: Oxygen Saturation is 98% on room air, normal by my interpretation.    COORDINATION OF CARE: 9:23 PM -Will refer to a hand specialist. Patient  verbalizes understanding and agrees with treatment plan.  Labs Review Labs Reviewed  BASIC METABOLIC PANEL - Abnormal; Notable for the following:    Glucose, Bld 126 (*)    All other components within normal limits  CBC WITH DIFFERENTIAL - Abnormal; Notable for the following:    WBC 13.0 (*)    Neutro Abs 9.3 (*)    All other components within normal limits   Imaging Review Dg Wrist Complete Left  09/18/2013   CLINICAL DATA:  Fall, wrist pain  EXAM: LEFT WRIST - COMPLETE 3+ VIEW  COMPARISON:  None.  FINDINGS: Comminuted fracture involving the distal radial metaphysis. Approximately 1/2 shaft width radial displacement and approximately 1/4 shaft width dorsal displacement of the dominant fracture  fragments. Carpus is appropriately located with respect to the distal radial fracture fragment.  Mildly displaced fracture involving the distal ulnar metaphysis, with approximately 1/2 shaft with radial displacement of the distal fracture fragment.  Associated soft tissue swelling.   IMPRESSION: Displaced, comminuted distal radial and ulnar fractures, as described above.     Electronically Signed   By: Charline Bills M.D.   On: 09/18/2013 20:01   I discussed the case with Dr. Amanda Pea.  Patient will have a sugar tong splint applied tonight and is instructed not to the after midnight.  Is to go to the second floor outpatient surgery center at [facility] tomorrow and ask for Dr.Gramig. EKG Interpretation    Date/Time:  Friday September 18 2013 22:35:52 EST Ventricular Rate:  94 PR Interval:  144 QRS Duration: 80 QT Interval:  360 QTC Calculation: 450 R Axis:   8 Text Interpretation:  Normal sinus rhythm Cannot rule out Anterior infarct , age undetermined No significant change since last tracing Confirmed by Chi Woodham  MD, Edie Darley ([id number]) on 09/18/2013 10:43:18 PM            MDM   1. Wrist fracture, left      I personally performed the services described in this documentation, which was scribed in my presence. The recorded information has been reviewed and considered.    Kristen Shi, MD 09/18/13 [phone]

## 2013-09-18 NOTE — ED Notes (Signed)
Left wrist injury. She slipped and fell while at a play tonight. Cardboard splint and ice on arrival to triage.

## 2013-09-19 ENCOUNTER — Encounter (HOSPITAL_COMMUNITY): Payer: BC Managed Care – PPO | Admitting: Anesthesiology

## 2013-09-19 ENCOUNTER — Observation Stay (HOSPITAL_COMMUNITY)
Admission: EM | Admit: 2013-09-19 | Discharge: 2013-09-20 | Disposition: A | Discharge status: Home / Self Care | Payer: BC Managed Care – PPO | Source: Other Acute Inpatient Hospital | Attending: Orthopedic Surgery | Admitting: Orthopedic Surgery

## 2013-09-19 ENCOUNTER — Encounter (HOSPITAL_COMMUNITY)
Admission: EM | Disposition: A | Discharge status: Home / Self Care | Payer: Self-pay | Source: Other Acute Inpatient Hospital | Attending: Orthopedic Surgery

## 2013-09-19 ENCOUNTER — Ambulatory Visit (HOSPITAL_COMMUNITY): Payer: BC Managed Care – PPO | Admitting: Anesthesiology

## 2013-09-19 ENCOUNTER — Encounter (HOSPITAL_COMMUNITY): Payer: Self-pay | Admitting: *Deleted

## 2013-09-19 DIAGNOSIS — F3289 Other specified depressive episodes: Secondary | ICD-10-CM | POA: Insufficient documentation

## 2013-09-19 DIAGNOSIS — F329 Major depressive disorder, single episode, unspecified: Secondary | ICD-10-CM | POA: Insufficient documentation

## 2013-09-19 DIAGNOSIS — G589 Mononeuropathy, unspecified: Secondary | ICD-10-CM | POA: Insufficient documentation

## 2013-09-19 DIAGNOSIS — E669 Obesity, unspecified: Secondary | ICD-10-CM | POA: Insufficient documentation

## 2013-09-19 DIAGNOSIS — S52309A Unspecified fracture of shaft of unspecified radius, initial encounter for closed fracture: Principal | ICD-10-CM | POA: Insufficient documentation

## 2013-09-19 DIAGNOSIS — W19XXXA Unspecified fall, initial encounter: Secondary | ICD-10-CM | POA: Insufficient documentation

## 2013-09-19 DIAGNOSIS — S52209P Unspecified fracture of shaft of unspecified ulna, subsequent encounter for closed fracture with malunion: Secondary | ICD-10-CM

## 2013-09-19 DIAGNOSIS — Z23 Encounter for immunization: Secondary | ICD-10-CM | POA: Insufficient documentation

## 2013-09-19 DIAGNOSIS — M899 Disorder of bone, unspecified: Secondary | ICD-10-CM | POA: Insufficient documentation

## 2013-09-19 DIAGNOSIS — M949 Disorder of cartilage, unspecified: Secondary | ICD-10-CM

## 2013-09-19 DIAGNOSIS — S52209A Unspecified fracture of shaft of unspecified ulna, initial encounter for closed fracture: Principal | ICD-10-CM | POA: Insufficient documentation

## 2013-09-19 DIAGNOSIS — IMO0002 Reserved for concepts with insufficient information to code with codable children: Secondary | ICD-10-CM | POA: Insufficient documentation

## 2013-09-19 DIAGNOSIS — S5290XP Unspecified fracture of unspecified forearm, subsequent encounter for closed fracture with malunion: Secondary | ICD-10-CM | POA: Diagnosis present

## 2013-09-19 HISTORY — PX: ORIF WRIST FRACTURE: SHX2133

## 2013-09-19 HISTORY — DX: Depression, unspecified: F32.A

## 2013-09-19 HISTORY — DX: Major depressive disorder, single episode, unspecified: F32.9

## 2013-09-19 SURGERY — OPEN REDUCTION INTERNAL FIXATION (ORIF) WRIST FRACTURE
Anesthesia: General | Site: Wrist | Laterality: Left

## 2013-09-19 MED ORDER — MIDAZOLAM HCL 2 MG/2ML IJ SOLN
INTRAMUSCULAR | Status: AC
  Administered 2013-09-19: 2 mg via INTRAVENOUS
  Filled 2013-09-19: qty 2

## 2013-09-19 MED ORDER — FAMOTIDINE 20 MG PO TABS
20.0000 mg | ORAL_TABLET | Freq: Two times a day (BID) | ORAL | Status: DC | PRN
  Filled 2013-09-19: qty 1

## 2013-09-19 MED ORDER — ACETAMINOPHEN 325 MG PO TABS
650.0000 mg | ORAL_TABLET | ORAL | Status: DC | PRN
  Administered 2013-09-19 – 2013-09-20 (×2): 650 mg via ORAL
  Filled 2013-09-19 (×2): qty 2

## 2013-09-19 MED ORDER — INFLUENZA VAC SPLIT QUAD 0.5 ML IM SUSP
0.5000 mL | INTRAMUSCULAR | Status: AC
  Administered 2013-09-20: 0.5 mL via INTRAMUSCULAR
  Filled 2013-09-19: qty 0.5

## 2013-09-19 MED ORDER — ONDANSETRON HCL 4 MG PO TABS: 4.0000 mg | ORAL_TABLET | Freq: Four times a day (QID) | ORAL | Status: DC | PRN

## 2013-09-19 MED ORDER — CEFAZOLIN SODIUM 1-5 GM-% IV SOLN
1.0000 g | Freq: Three times a day (TID) | INTRAVENOUS | Status: DC
  Administered 2013-09-19 – 2013-09-20 (×3): 1 g via INTRAVENOUS
  Filled 2013-09-19 (×6): qty 50

## 2013-09-19 MED ORDER — OXYCODONE HCL 5 MG PO TABS: 5.0000 mg | ORAL_TABLET | Freq: Once | ORAL | Status: DC | PRN

## 2013-09-19 MED ORDER — ONDANSETRON HCL 4 MG/2ML IJ SOLN: 4.0000 mg | Freq: Four times a day (QID) | INTRAMUSCULAR | Status: DC | PRN

## 2013-09-19 MED ORDER — ALPRAZOLAM 0.5 MG PO TABS: 0.5000 mg | ORAL_TABLET | Freq: Four times a day (QID) | ORAL | Status: DC | PRN

## 2013-09-19 MED ORDER — LACTATED RINGERS IV SOLN: INTRAVENOUS | Status: DC

## 2013-09-19 MED ORDER — TEMAZEPAM 15 MG PO CAPS: 15.0000 mg | ORAL_CAPSULE | Freq: Every evening | ORAL | Status: DC | PRN

## 2013-09-19 MED ORDER — METHOCARBAMOL 100 MG/ML IJ SOLN
500.0000 mg | Freq: Four times a day (QID) | INTRAVENOUS | Status: DC | PRN
  Filled 2013-09-19: qty 5

## 2013-09-19 MED ORDER — MIDAZOLAM HCL 2 MG/2ML IJ SOLN
2.0000 mg | Freq: Once | INTRAMUSCULAR | Status: AC
  Administered 2013-09-19: 2 mg via INTRAVENOUS

## 2013-09-19 MED ORDER — CEFAZOLIN SODIUM 1-5 GM-% IV SOLN
1.0000 g | INTRAVENOUS | Status: AC
  Administered 2013-09-19: 1 g via INTRAVENOUS
  Filled 2013-09-19: qty 50

## 2013-09-19 MED ORDER — FENTANYL CITRATE 0.05 MG/ML IJ SOLN
INTRAMUSCULAR | Status: AC
  Administered 2013-09-19: 100 ug via INTRAVENOUS
  Filled 2013-09-19: qty 2

## 2013-09-19 MED ORDER — PROMETHAZINE HCL 25 MG RE SUPP: 12.5000 mg | Freq: Four times a day (QID) | RECTAL | Status: DC | PRN

## 2013-09-19 MED ORDER — FENTANYL CITRATE 0.05 MG/ML IJ SOLN
100.0000 ug | Freq: Once | INTRAMUSCULAR | Status: AC
Start: 2013-09-19 — End: 2013-09-19
  Administered 2013-09-19: 100 ug via INTRAVENOUS

## 2013-09-19 MED ORDER — OXYCODONE HCL 5 MG/5ML PO SOLN: 5.0000 mg | Freq: Once | ORAL | Status: DC | PRN

## 2013-09-19 MED ORDER — DIPHENHYDRAMINE HCL 25 MG PO CAPS: 25.0000 mg | ORAL_CAPSULE | Freq: Four times a day (QID) | ORAL | Status: DC | PRN

## 2013-09-19 MED ORDER — HYDROMORPHONE HCL PF 1 MG/ML IJ SOLN: 0.2500 mg | INTRAMUSCULAR | Status: DC | PRN

## 2013-09-19 MED ORDER — METHOCARBAMOL 500 MG PO TABS: 500.0000 mg | ORAL_TABLET | Freq: Four times a day (QID) | ORAL | Status: DC | PRN

## 2013-09-19 MED ORDER — HYDROMORPHONE HCL PF 1 MG/ML IJ SOLN: 0.5000 mg | INTRAMUSCULAR | Status: DC | PRN

## 2013-09-19 MED ORDER — CEFAZOLIN SODIUM-DEXTROSE 2-3 GM-% IV SOLR
INTRAVENOUS | Status: AC
  Filled 2013-09-19: qty 50

## 2013-09-19 MED ORDER — ONDANSETRON HCL 4 MG/2ML IJ SOLN: 4.0000 mg | Freq: Once | INTRAMUSCULAR | Status: DC | PRN

## 2013-09-19 MED ORDER — SENNA 8.6 MG PO TABS
1.0000 | ORAL_TABLET | Freq: Two times a day (BID) | ORAL | Status: DC
  Administered 2013-09-20: 8.6 mg via ORAL
  Filled 2013-09-19 (×3): qty 1

## 2013-09-19 MED ORDER — PROPOFOL 10 MG/ML IV BOLUS
INTRAVENOUS | Status: DC | PRN
  Administered 2013-09-19: 200 mg via INTRAVENOUS

## 2013-09-19 MED ORDER — FENTANYL CITRATE 0.05 MG/ML IJ SOLN
INTRAMUSCULAR | Status: DC | PRN
  Administered 2013-09-19: 100 ug via INTRAVENOUS
  Administered 2013-09-19: 50 ug via INTRAVENOUS

## 2013-09-19 MED ORDER — CEFAZOLIN SODIUM-DEXTROSE 2-3 GM-% IV SOLR
2.0000 g | Freq: Once | INTRAVENOUS | Status: AC
Start: 2013-09-19 — End: 2013-09-19
  Administered 2013-09-19: 2 g via INTRAVENOUS
  Filled 2013-09-19: qty 50

## 2013-09-19 MED ORDER — 0.9 % SODIUM CHLORIDE (POUR BTL) OPTIME
TOPICAL | Status: DC | PRN
  Administered 2013-09-19: 1000 mL

## 2013-09-19 MED ORDER — LACTATED RINGERS IV SOLN
INTRAVENOUS | Status: DC
  Administered 2013-09-19 (×3): via INTRAVENOUS

## 2013-09-19 MED ORDER — LIDOCAINE HCL (CARDIAC) 20 MG/ML IV SOLN
INTRAVENOUS | Status: DC | PRN
  Administered 2013-09-19: 80 mg via INTRAVENOUS

## 2013-09-19 MED ORDER — ADULT MULTIVITAMIN W/MINERALS CH
1.0000 | ORAL_TABLET | Freq: Every day | ORAL | Status: DC
  Administered 2013-09-19 – 2013-09-20 (×2): 1 via ORAL
  Filled 2013-09-19 (×2): qty 1

## 2013-09-19 MED ORDER — VITAMIN C 500 MG PO TABS
1000.0000 mg | ORAL_TABLET | Freq: Every day | ORAL | Status: DC
  Administered 2013-09-19 – 2013-09-20 (×2): 1000 mg via ORAL
  Filled 2013-09-19 (×2): qty 2

## 2013-09-19 MED ORDER — OXYCODONE HCL 5 MG PO TABS
5.0000 mg | ORAL_TABLET | ORAL | Status: DC | PRN
  Administered 2013-09-20: 10 mg via ORAL
  Filled 2013-09-19: qty 2

## 2013-09-19 SURGICAL SUPPLY — 76 items
BANDAGE ELASTIC 3 VELCRO ST LF (GAUZE/BANDAGES/DRESSINGS) ×3 IMPLANT
BANDAGE ELASTIC 4 VELCRO ST LF (GAUZE/BANDAGES/DRESSINGS) ×3 IMPLANT
BANDAGE GAUZE ELAST BULKY 4 IN (GAUZE/BANDAGES/DRESSINGS) ×3 IMPLANT
BIT DRILL 2.2 SS TIBIAL (BIT) ×3 IMPLANT
BLADE SURG ROTATE 9660 (MISCELLANEOUS) IMPLANT
BNDG ESMARK 4X9 LF (GAUZE/BANDAGES/DRESSINGS) ×3 IMPLANT
BNDG GAUZE ELAST 4 BULKY (GAUZE/BANDAGES/DRESSINGS) ×3 IMPLANT
CLOTH BEACON ORANGE TIMEOUT ST (SAFETY) IMPLANT
CORDS BIPOLAR (ELECTRODE) ×3 IMPLANT
COVER SURGICAL LIGHT HANDLE (MISCELLANEOUS) ×3 IMPLANT
CUFF TOURNIQUET SINGLE 18IN (TOURNIQUET CUFF) ×3 IMPLANT
CUFF TOURNIQUET SINGLE 24IN (TOURNIQUET CUFF) IMPLANT
DRAIN TLS ROUND 10FR (DRAIN) IMPLANT
DRAPE OEC MINIVIEW 54X84 (DRAPES) ×3 IMPLANT
DRAPE SURG 17X11 SM STRL (DRAPES) ×3 IMPLANT
DRAPE SURG 17X23 STRL (DRAPES) IMPLANT
GAUZE XEROFORM 1X8 LF (GAUZE/BANDAGES/DRESSINGS) IMPLANT
GAUZE XEROFORM 5X9 LF (GAUZE/BANDAGES/DRESSINGS) ×6 IMPLANT
GLOVE BIO SURGEON STRL SZ7.5 (GLOVE) ×3 IMPLANT
GLOVE BIOGEL M STRL SZ7.5 (GLOVE) ×3 IMPLANT
GLOVE BIOGEL PI IND STRL 7.5 (GLOVE) ×1 IMPLANT
GLOVE BIOGEL PI INDICATOR 7.5 (GLOVE) ×2
GLOVE ECLIPSE 7.5 STRL STRAW (GLOVE) ×3 IMPLANT
GLOVE SS BIOGEL STRL SZ 8 (GLOVE) ×1 IMPLANT
GLOVE SUPERSENSE BIOGEL SZ 8 (GLOVE) ×2
GOWN SRG XL XLNG 56XLVL 4 (GOWN DISPOSABLE) ×2 IMPLANT
GOWN STRL NON-REIN LRG LVL3 (GOWN DISPOSABLE) ×3 IMPLANT
GOWN STRL NON-REIN XL XLG LVL4 (GOWN DISPOSABLE) ×4
KIT BASIN OR (CUSTOM PROCEDURE TRAY) ×3 IMPLANT
KIT ROOM TURNOVER OR (KITS) ×3 IMPLANT
LOOP VESSEL MAXI BLUE (MISCELLANEOUS) IMPLANT
MANIFOLD NEPTUNE II (INSTRUMENTS) ×3 IMPLANT
NEEDLE 22X1 1/2 (OR ONLY) (NEEDLE) IMPLANT
NS IRRIG 1000ML POUR BTL (IV SOLUTION) ×3 IMPLANT
PACK ORTHO EXTREMITY (CUSTOM PROCEDURE TRAY) ×3 IMPLANT
PAD ARMBOARD 7.5X6 YLW CONV (MISCELLANEOUS) ×6 IMPLANT
PAD CAST 3X4 CTTN HI CHSV (CAST SUPPLIES) ×1 IMPLANT
PAD CAST 4YDX4 CTTN HI CHSV (CAST SUPPLIES) ×1 IMPLANT
PADDING CAST COTTON 3X4 STRL (CAST SUPPLIES) ×2
PADDING CAST COTTON 4X4 STRL (CAST SUPPLIES) ×2
PEG LOCKING SMOOTH 2.2X18 (Peg) ×6 IMPLANT
PLATE LONG DVR LEFT (Plate) ×3 IMPLANT
PUTTY DBM STAGRAFT 5CC (Putty) ×3 IMPLANT
SCREW LOCK 10X2.7X3 LD THRD (Screw) ×1 IMPLANT
SCREW LOCK 12X2.7X 3 LD (Screw) ×2 IMPLANT
SCREW LOCK 14X2.7X 3 LD TPR (Screw) ×1 IMPLANT
SCREW LOCK 16X2.7X 3 LD TPR (Screw) ×1 IMPLANT
SCREW LOCK 18X2.7X 3 LD TPR (Screw) ×3 IMPLANT
SCREW LOCK 20X2.7X 3 LD TPR (Screw) ×1 IMPLANT
SCREW LOCK 22X2.7X 3 LD TPR (Screw) ×2 IMPLANT
SCREW LOCKING 2.7X10MM (Screw) ×2 IMPLANT
SCREW LOCKING 2.7X12MM (Screw) ×4 IMPLANT
SCREW LOCKING 2.7X13MM (Screw) ×3 IMPLANT
SCREW LOCKING 2.7X14 (Screw) ×2 IMPLANT
SCREW LOCKING 2.7X16 (Screw) ×2 IMPLANT
SCREW LOCKING 2.7X18 (Screw) ×6 IMPLANT
SCREW LOCKING 2.7X20MM (Screw) ×2 IMPLANT
SCREW LOCKING 2.7X22MM (Screw) ×4 IMPLANT
SCRUB BETADINE 4OZ XXX (MISCELLANEOUS) ×3 IMPLANT
SET IRRIG Y TYPE TUR BLADDER L (SET/KITS/TRAYS/PACK) ×3 IMPLANT
SOLUTION BETADINE 4OZ (MISCELLANEOUS) ×3 IMPLANT
SPLINT FIBERGLASS 3X35 (CAST SUPPLIES) ×3 IMPLANT
SPONGE GAUZE 4X4 12PLY (GAUZE/BANDAGES/DRESSINGS) ×3 IMPLANT
SPONGE LAP 4X18 X RAY DECT (DISPOSABLE) ×3 IMPLANT
SUT MNCRL AB 4-0 PS2 18 (SUTURE) IMPLANT
SUT PROLENE 3 0 PS 2 (SUTURE) IMPLANT
SUT PROLENE 4 0 PS 2 18 (SUTURE) ×6 IMPLANT
SUT VIC AB 3-0 FS2 27 (SUTURE) IMPLANT
SYR CONTROL 10ML LL (SYRINGE) IMPLANT
SYSTEM CHEST DRAIN TLS 7FR (DRAIN) IMPLANT
TOWEL OR 17X24 6PK STRL BLUE (TOWEL DISPOSABLE) ×6 IMPLANT
TOWEL OR 17X26 10 PK STRL BLUE (TOWEL DISPOSABLE) ×3 IMPLANT
TUBE CONNECTING 12'X1/4 (SUCTIONS) ×1
TUBE CONNECTING 12X1/4 (SUCTIONS) ×2 IMPLANT
TUBE EVACUATION TLS (MISCELLANEOUS) ×3 IMPLANT
WATER STERILE IRR 1000ML POUR (IV SOLUTION) IMPLANT

## 2013-09-19 NOTE — Op Note (Signed)
See Dictation #[id number] Kristen PeaGramig Md

## 2013-09-19 NOTE — H&P (Signed)
[name] is an 48 y.o. female.   Chief Complaint: Patient presents for evaluation and treatment left radius and ulna fractures comminuted complex HPI: Patient presents for evaluation and treatment left radius and ulnar fractures. We are planning open reduction internal fixation. She is alert and oriented. She denies neck back chest or abdominal pain. She is alert and oriented. The patient states that after her fall she had tremendous pain in her left wrist and hand. She denies lower stem knee pain.  Her past medical history is significant for 2 broken wrist in the distant past treated by Dr. Eddie Dibbles. Her most recent fracture was in 2005 and successfully underwent closed treatment. She states she did not have residual deformity to her knowledge  She presents for evaluation and surgical management of a very comminuted complex fracture today.  Past Medical History  Diagnosis Date  . Depression     Past Surgical History  Procedure Laterality Date  . Carpal tunnel release    . Cesarean section      History reviewed. No pertinent family history. Social History:  reports that she has never smoked. She does not have any smokeless tobacco history on file. She reports that she does not drink alcohol or use illicit drugs.  Allergies: No Known Allergies  Medications Prior to Admission  Medication Sig Dispense Refill  . oxyCODONE-acetaminophen (PERCOCET) 7.5-325 MG per tablet Take 1 tablet by mouth every 4 (four) hours as needed for pain.  30 tablet  0    Results for orders placed during the hospital encounter of 09/18/13 (from the past 48 hour(s))  BASIC METABOLIC PANEL     Status: Abnormal   Collection Time    09/18/13  9:50 PM      Result Value Range   Sodium 140  137 - 147 mEq/L   Potassium 3.7  3.7 - 5.3 mEq/L   Chloride 103  96 - 112 mEq/L   CO2 22  19 - 32 mEq/L   Glucose, Bld 126 (*) 70 - 99 mg/dL   BUN 9  6 - 23 mg/dL   Creatinine, Ser 0.60  0.50 - 1.10 mg/dL   Calcium 9.0   8.4 - 10.5 mg/dL   GFR calc non Af Amer >90  >90 mL/min   GFR calc Af Amer >90  >90 mL/min   Comment: (NOTE)     The eGFR has been calculated using the CKD EPI equation.     This calculation has not been validated in all clinical situations.     eGFR's persistently <90 mL/min signify possible Chronic Kidney     Disease.  CBC WITH DIFFERENTIAL     Status: Abnormal   Collection Time    09/18/13  9:50 PM      Result Value Range   WBC 13.0 (*) 4.0 - 10.5 K/uL   RBC 4.36  3.87 - 5.11 MIL/uL   Hemoglobin 12.3  12.0 - 15.0 g/dL   HCT 37.1  36.0 - 46.0 %   MCV 85.1  78.0 - 100.0 fL   MCH 28.2  26.0 - 34.0 pg   MCHC 33.2  30.0 - 36.0 g/dL   RDW 13.5  11.5 - 15.5 %   Platelets 376  150 - 400 K/uL   Neutrophils Relative % 72  43 - 77 %   Neutro Abs 9.3 (*) 1.7 - 7.7 K/uL   Lymphocytes Relative 21  12 - 46 %   Lymphs Abs 2.7  0.7 - 4.0 K/uL  Monocytes Relative 7  3 - 12 %   Monocytes Absolute 0.9  0.1 - 1.0 K/uL   Eosinophils Relative 1  0 - 5 %   Eosinophils Absolute 0.1  0.0 - 0.7 K/uL   Basophils Relative 0  0 - 1 %   Basophils Absolute 0.0  0.0 - 0.1 K/uL   Dg Chest 2 View  09/18/2013   CLINICAL DATA:  Preop, left wrist fracture  EXAM: CHEST  2 VIEW  COMPARISON:  01/20/2010  FINDINGS: Lungs are clear.  No pleural effusion or pneumothorax.  The heart is top-normal in size.  Mild degenerative changes of the visualized thoracolumbar spine.  IMPRESSION: No evidence of acute cardiopulmonary disease.   Electronically Signed   By: Julian Hy M.D.   On: 09/18/2013 22:12   Dg Wrist Complete Left  09/18/2013   CLINICAL DATA:  Fall, wrist pain  EXAM: LEFT WRIST - COMPLETE 3+ VIEW  COMPARISON:  None.  FINDINGS: Comminuted fracture involving the distal radial metaphysis. Approximately 1/2 shaft width radial displacement and approximately 1/4 shaft width dorsal displacement of the dominant fracture fragments. Carpus is appropriately located with respect to the distal radial fracture fragment.   Mildly displaced fracture involving the distal ulnar metaphysis, with approximately 1/2 shaft with radial displacement of the distal fracture fragment.  Associated soft tissue swelling.  IMPRESSION: Displaced, comminuted distal radial and ulnar fractures, as described above.   Electronically Signed   By: Julian Hy M.D.   On: 09/18/2013 20:01    Review of Systems  HENT: Negative.   Eyes: Negative.   Respiratory: Negative.   Cardiovascular: Negative.   Gastrointestinal: Negative.   Genitourinary: Negative.   Neurological: Negative.   Endo/Heme/Allergies: Negative.   Psychiatric/Behavioral: Negative.     Blood pressure 120/64, pulse 77, temperature 98.9 F (37.2 C), temperature source Oral, resp. rate 16, last menstrual period 08/21/2013, SpO2 99.00%. Physical Exam obese female alert and oriented no acute distress left hand is sensate she can we'll her fingers slightly shoulder and upper arm are stable there is no signs of compartment syndrome. Her neck and back are nontender abdomen soft nondistended nontender chest is clear lower extremity examination is benign. The patient's right upper extremity is neurovascularly intact with IV access. The patient has normal sensation range of motion to her lower extremities. There is no signs of vascular compromise in the upper or lower extremities. Skin conditions. The quiet since.  Assessment/Plan We will plan ORIF radius and ulna as necessary with repair reconstruction as necessary and operative conditions dictate. We have discussed all issues these notes etc. and we'll proceed accordingly.  Marland Kitchen.We are planning surgery for your upper extremity. The risk and benefits of surgery include risk of bleeding infection anesthesia damage to normal structures and failure of the surgery to accomplish its intended goals of relieving symptoms and restoring function with this in mind we'll going to proceed. I have specifically discussed with the patient the  pre-and postoperative regime and the does and don'ts and risk and benefits in great detail. Risk and benefits of surgery also include risk of dystrophy chronic nerve pain failure of the healing process to go onto completion and other inherent risks of surgery The relavent the pathophysiology of the disease/injury process, as well as the alternatives for treatment and postoperative course of action has been discussed in great detail with the patient who desires to proceed.  We will do everything in our power to help you (the patient) restore function to the  upper extremity. Is a pleasure to see this patient today.   GRAMIG III,WILLIAM M 09/19/2013, 11:17 AM    

## 2013-09-19 NOTE — Anesthesia Procedure Notes (Addendum)
Procedure Name: LMA Insertion Date/Time: 09/19/2013 11:56 AM Performed by: Tyrone NineSAUVE, ROBIN F Pre-anesthesia Checklist: Patient identified, Timeout performed, Emergency Drugs available, Suction available and Patient being monitored Patient Re-evaluated:Patient Re-evaluated prior to inductionOxygen Delivery Method: Circle system utilized Preoxygenation: Pre-oxygenation with 100% oxygen Intubation Type: IV induction Ventilation: Mask ventilation without difficulty LMA: LMA with gastric port inserted LMA Size: 4.0 Number of attempts: 1 Placement Confirmation: breath sounds checked- equal and bilateral Tube secured with: Tape Dental Injury: Teeth and Oropharynx as per pre-operative assessment    Anesthesia Regional Block:  Supraclavicular block  Pre-Anesthetic Checklist: ,, timeout performed, Correct Patient, Correct Site, Correct Laterality, Correct Procedure, Correct Position, site marked, Risks and benefits discussed,  Surgical consent,  Pre-op evaluation,  At surgeon's request and post-op pain management  Laterality: Left  Prep: chloraprep       Needles:  Injection technique: Single-shot  Needle Type: Echogenic Stimulator Needle     Needle Length:cm 9 cm Needle Gauge: 22 and 22 G    Additional Needles: Supraclavicular block Narrative:  Start time: 09/19/2013 11:20 AM End time: 09/19/2013 11:25 AM Injection made incrementally with aspirations every 5 mL.  Performed by: Personally   Additional Notes: 25 cc 0.5% Marcaine with 1:200 Epi and 8 mg decadron injected easily

## 2013-09-19 NOTE — Transfer of Care (Signed)
Immediate Anesthesia Transfer of Care Note  Patient: [name]  Procedure(s) Performed: Procedure(s) with comments: OPEN REDUCTION INTERNAL FIXATION (ORIF) WRIST FRACTURE (Left) - LEFT DISTAL RADIUS AND ULNAR FRACTURE WITH REPAIR AND RECONSTRUCTION  Patient Location: PACU  Anesthesia Type:General  Level of Consciousness: awake, alert , oriented and patient cooperative  Airway & Oxygen Therapy: Patient Spontanous Breathing and Patient connected to nasal cannula oxygen  Post-op Assessment: Report given to PACU RN and Post -op Vital signs reviewed and stable  Post vital signs: Reviewed and stable  Complications: No apparent anesthesia complications

## 2013-09-19 NOTE — Anesthesia Preprocedure Evaluation (Addendum)
Anesthesia Evaluation  Patient identified by MRN, date of birth, ID band Patient awake    Reviewed: Allergy & Precautions, H&P , NPO status , Patient's Chart, lab work & pertinent test results  Airway Mallampati: II TM Distance: >3 FB     Dental  (+) Teeth Intact and Dental Advisory Given   Pulmonary neg pulmonary ROS,  breath sounds clear to auscultation  Pulmonary exam normal       Cardiovascular negative cardio ROS  Rhythm:Regular Rate:Normal     Neuro/Psych Depression negative neurological ROS     GI/Hepatic negative GI ROS, Neg liver ROS,   Endo/Other  negative endocrine ROS  Renal/GU negative Renal ROS     Musculoskeletal negative musculoskeletal ROS (+)   Abdominal Normal abdominal exam  (+) + obese,   Peds  Hematology negative hematology ROS (+)   Anesthesia Other Findings Obese  Reproductive/Obstetrics negative OB ROS                         Anesthesia Physical Anesthesia Plan  ASA: II  Anesthesia Plan: General   Post-op Pain Management:    Induction: Intravenous  Airway Management Planned: LMA  Additional Equipment:   Intra-op Plan:   Post-operative Plan: Extubation in OR  Informed Consent:   Dental advisory given  Plan Discussed with: CRNA and Anesthesiologist  Anesthesia Plan Comments:         Anesthesia Quick Evaluation

## 2013-09-19 NOTE — Preoperative (Signed)
Beta Blockers   Reason not to administer Beta Blockers:Not Applicable 

## 2013-09-19 NOTE — Anesthesia Postprocedure Evaluation (Signed)
  Anesthesia Post-op Note  Patient: [name]  Procedure(s) Performed: Procedure(s) with comments: OPEN REDUCTION INTERNAL FIXATION (ORIF) WRIST FRACTURE (Left) - LEFT DISTAL RADIUS AND ULNAR FRACTURE WITH REPAIR AND RECONSTRUCTION  Patient Location: PACU  Anesthesia Type:GA combined with regional for post-op pain  Level of Consciousness: awake, alert  and oriented  Airway and Oxygen Therapy: Patient Spontanous Breathing  Post-op Pain: none  Post-op Assessment: Post-op Vital signs reviewed, Patient's Cardiovascular Status Stable, Respiratory Function Stable, Patent Airway and Pain level controlled  Post-op Vital Signs: stable  Complications: No apparent anesthesia complications

## 2013-09-20 MED ORDER — METHOCARBAMOL 500 MG PO TABS: 500.0000 mg | ORAL_TABLET | Freq: Four times a day (QID) | ORAL | Status: AC | PRN

## 2013-09-20 MED ORDER — OXYCODONE HCL 5 MG PO TABS: 5.0000 mg | ORAL_TABLET | ORAL | Status: AC | PRN

## 2013-09-20 NOTE — Evaluation (Addendum)
Occupational Therapy Evaluation Patient Details Name: [name] MRN: [id number] DOB: [birth year] Today's Date: 09/20/2013 Time: 9562-1308 OT Time Calculation (min): 28 min  OT Assessment / Plan / Recommendation History of present illness 48 y.o. s/p OPEN REDUCTION INTERNAL FIXATION (ORIF) WRIST FRACTURE (Left) - LEFT DISTAL RADIUS AND ULNAR FRACTURE WITH REPAIR AND RECONSTRUCTION   Clinical Impression   Education provided to pt. Feel pt is safe to d/c home from OT standpoint with husband available to assist initially 24/7.     OT Assessment  Patient does not need any further OT services    Follow Up Recommendations  No OT follow up;Supervision - Intermittent    Barriers to Discharge      Equipment Recommendations  3 in 1 bedside comode    Recommendations for Other Services    Frequency       Precautions / Restrictions Precautions Precaution Comments: No pushing, pulling lifting; elevation, edema control, ADLs, Required Braces or Orthoses: Sling Restrictions Weight Bearing Restrictions: Yes LUE Weight Bearing: Non weight bearing   Pertinent Vitals/Pain No pain reported. Iced applied and elevated LUE at end of session.     ADL  Eating/Feeding: Set up Where Assessed - Eating/Feeding: Bed level Upper Body Dressing: Minimal assistance Where Assessed - Upper Body Dressing: Unsupported sitting Lower Body Dressing: Minimal assistance Where Assessed - Lower Body Dressing: Unsupported sit to stand Toilet Transfer: Min guard Toilet Transfer Method: Sit to Barista: Regular height toilet Tub/Shower Transfer: Min guard Tub/Shower Transfer Method: Science writer: Other (comment) (practiced stepping over) Equipment Used: Gait belt;Other (comment) (shoulder sling) Transfers/Ambulation Related to ADLs: Supervision for ambulation and Min guard/supervision for transfers. ADL Comments: Recommended sitting on chair for bathing and  having someone with her getting in and out of tub. Recommended sitting to get dressed. Educated on dressing technique. Discussed importance of ice, elevating, and moving digits to help with edema.  Educated on donning/doffing shoulder sling and correct positioning.    OT Diagnosis:    OT Problem List:   OT Treatment Interventions:     OT Goals(Current goals can be found in the care plan section)    Visit Information  Last OT Received On: 09/20/13 Assistance Needed: +1 History of Present Illness: 48 y.o. s/p OPEN REDUCTION INTERNAL FIXATION (ORIF) WRIST FRACTURE (Left) - LEFT DISTAL RADIUS AND ULNAR FRACTURE WITH REPAIR AND RECONSTRUCTION       Prior Functioning     Home Living Family/patient expects to be discharged to:: Private residence Living Arrangements: Spouse/significant other;Children Available Help at Discharge: Family;Available PRN/intermittently Type of Home: Mobile home Home Access: Stairs to enter Entrance Stairs-Number of Steps: 3 Entrance Stairs-Rails: None Home Layout: One level Home Equipment: None Prior Function Level of Independence: Independent Communication Communication: No difficulties Dominant Hand: Right         Vision/Perception Vision - History Baseline Vision: Wears glasses all the time   Cognition  Cognition Arousal/Alertness: Awake/alert Behavior During Therapy: WFL for tasks assessed/performed Overall Cognitive Status: Within Functional Limits for tasks assessed    Extremity/Trunk Assessment Upper Extremity Assessment Upper Extremity Assessment: LUE deficits/detail LUE Deficits / Details: ORIF left wrist fracture LUE: Unable to fully assess due to immobilization LUE Sensation: decreased light touch     Mobility Bed Mobility Overal bed mobility: Needs Assistance Bed Mobility: Supine to Sit Supine to sit: Supervision General bed mobility comments: supervision to be sure she maintained precautions. Transfers Overall transfer  level: Needs assistance Equipment used: None Transfers: Sit to/from  Stand Sit to Stand: Supervision General transfer comment: supervision for safety.      Exercise     Balance     End of Session OT - End of Session Equipment Utilized During Treatment: Gait belt;Other (comment) (shoulder sling) Activity Tolerance: Patient tolerated treatment well Patient left: in chair;with call bell/phone within reach;with family/visitor present Nurse Communication: Other (comment) (smaller sling)  GO Functional Assessment Tool Used: clinical judgment Functional Limitation: Self care Self Care Current Status ([id number]([id number]): At least 1 percent but less than 20 percent impaired, limited or restricted Self Care Goal Status ([id number]([id number]): At least 1 percent but less than 20 percent impaired, limited or restricted Self Care Discharge Status [phone]([id number]): At least 1 percent but less than 20 percent impaired, limited or restricted   Earlie RavelingStraub, [name] OTR/L [phone] 09/20/2013, 10:04 AM

## 2013-09-20 NOTE — Progress Notes (Signed)
PT Cancellation Note  Patient Details Name: Kristen PunDeanna D Mcgrath MRN: [id number] DOB: 1966/08/31   Cancelled Treatment:    Reason Eval/Treat Not Completed: PT screened, no needs identified, will sign off   Van ClinesGarrigan, Ranyia Witting Sarah D Cul[facility]ts[facility] Hospitalamff 09/20/2013, 9:59 AM

## 2013-09-20 NOTE — Op Note (Signed)
NAMEBO, ROGUE               ACCOUNT NO.:  [id number]  MEDICAL RECORD NO.:  [id number]  LOCATION:  5N03C                        FACILITY:  [facility]  PHYSICIAN:  Dionne Ano. 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  PROCEDURES: 1. Open reduction and internal fixation with extended DVR plate,     utilizing AO technique, autologous bone grafting, and recreation of     the prior malunion geometry. 2. Sliding brachioradialis tenotomy. 3. Open reduction and internal fixation, distal ulna fracture with     tension band wire construct. 4. Dorsal sensory branch ulnar nerve neurolysis extensive in nature. 5. Stress radiography.  SURGEON:  Dionne Ano. Amanda Pea, MD  ASSISTANT:  Karie Chimera, PA-C  COMPLICATIONS:  None.  ANESTHESIA:  General, preoperative block.  TOURNIQUET TIME:  Less than two hours.  INDICATIONS:  A 48 year old female who presents with the above-mentioned diagnosis.  Her history is interesting and that she has had two prior distal radius fractures outside of the most recent traumatic injury which brought her to ER this weekend.  This patient was last treated in 2005, by Dr. Leighton Parody for a distal radius fracture and has some degree of a malunion with loss of volar tilt and loss of radial inclination.  I have discussed the risks and benefits of surgery and the difficulties in achieving good results and functional arc of motion.  With all issues in mind, she desires to proceed.  Our goal is to try and restore her back to where she was prior to the most recent injury this  weekend.  OPERATION IN DETAIL:  The patient was seen by myself and Anesthesia, taken to the operative room, underwent a smooth induction of general anesthetic.  She was laid supine and appropriately padded, prepped and draped in the usual sterile fashion with Betadine scrub and paint.  The left upper extremity was scrubbed and paint x2 and there were no complicating features.  Once this was accomplished, time-out was called. Pre and postop checklist complete.  Finger trap traction was placed. Arm was elevated.  Tourniquet was insufflated to 250 mmHg.  Once this was complete, volar radial incision was made.  Dissection was carried down.  FCR tendon sheath was incised palmarly and dorsally.  Fasciotomy was accomplished and a very comminuted fracture was noted.  The carpal canal contents were swept ulnarly.  The pronator quadratus was in very poor condition and shredded.  There were multiple fragments.  There was some degree of metaphyseal bone loss.  At this time, we performed a sliding brachioradialis tenotomy and lessen deforming forces, I split a Therapist, nutritional near this.  I noticed a large spike of bone and noticed that it was quite evident that this patient had a significant malunion.  As we pieced the pieces back together and bone grafted, the central defect in  the metaphyseal region with allograft bone graft, it was quite significant that the volar tilt and radial inclination had forever changed prior to even the most recent injury.  Thus, it was our task to try and afford her a recreation of her geometry.  At this time, we have prebent and extended DVR plate, and then with combination of provisional fixation followed by definitive fixation applied the extended plate.  We were able to recapitulate her radial inclination, volar tilt, and radial height, as it was preoperatively in our estimation.  We did consider trying to increase her volar tilt and increasing her radial  inclination, however, the anatomy and soft tissue constraints were very unyielding to this and I felt more comfortable recreating the anatomy she had going into this injury.  The plate was placed without difficulty, checked under AP, lateral, and oblique planes as well as live fluoro and looked good.  Following this, we pieced together as much of the pronator as we could, irrigated with greater than two liters of saline and closed the wound over TLS drain with tourniquet deflated. Following this, we identified the distal ulna which was fractured.  I did not feel comfortable in a 48 year old female, leaving her with displacement.  Thus, we made an incision under separate tourniquet, dissected down, identified the dorsal sensory branch of the ulnar nerve, it was very adhesed to all structures and thus we performed an extensive neurolysis to get this out of harm's way.  Following this, the patient had the fracture exposed, reduced.  Two 0.045 K-wires were placed down the shaft intramedullary without difficulty.  Once this was accomplished, two drill holes were placed farther up the shaft proximally and two 20-gauge wires were placed through this.  Once this was done, a standard tension band wire construct was accomplished with figure-of-eight tension banding.  The 0.045 K-wires were clipped bit and then stepped against the styloid region well away from the dorsal sensory branch to the ulnar nerve.  The patient tolerated this well.  There were no complicating features.  Once this was done, we took final copy of the x-rays.  There were no complicating features noted.  I was very pleased with this.  Thus, ORIF of both the ulna and radius afforded excellent recapitulation of the anatomy.  She tolerated this well.  There were no complications. Excellent refill was noted.  The wound was closed with Prolene.  No deep sutures were placed.  The patient had soft compartments, excellent refill, and  no complicating features.  She will be admitted for IV antibiotics, general postop observatory care measures.  Given the fragility of the fracture as well as the softness of the bone, I would recommend complete immobilization for four weeks followed by removable bracing between 4 and 6 weeks, but I am going to hold motion about the wrist and hold any aggressive forearm supination, pronation until she is 6 weeks out.  At 4 weeks, I would go into a long arm splint in neutral during nighttime activities and short arm could be considered during the day if radiographs are favorable.  We will await 6 weeks until we move for a hard and wait approximately 10 weeks postop until we begin a strengthening prior to carried it upon an osseous integration of course.  We will watch this closely along the way.  It is pleasure to participate in her care, I am looking forward to participate in her postop recovery.     Dionne AnoWilliam M. Amanda PeaGramig, M.D.  WMG/MEDQ  D:  09/19/2013  T:  09/19/2013  Job:  [id number]

## 2013-09-20 NOTE — Progress Notes (Signed)
Utilization review completed.  

## 2013-09-20 NOTE — Discharge Summary (Signed)
Physician Discharge Summary  Patient ID: [name] MRN: [id number] DOB/AGE: 09/20/1965 48 y.o.  Admit date: 09/19/2013 Discharge date: 09/20/2013  Admission Diagnoses: Status post closed fracture of the left distal radius and ulna, with prior history of distal radius fracture x2 with notable malunion preoperatively  Discharge Diagnoses: Same, status post open reduction internal fixation left distal radius and ulna fracture   Discharged Condition: Improved  Hospital Course: The patient is a pleasant 48 year old female who presented to the emergency room setting for evaluation of her left upper extremity status post a fall. She was noted to have sustained a comminuted displaced distal radius fracture as well as ulna fracture about the left upper extremity. She does have a history of a prior wrist fracture x2 to the outside of the office. It appears that she went onto but did have some degree of malunion based on her preoperative and interoperative films Consults: None   Treatments: Open reduction and internal fixation with extended DVR plate,  utilizing AO technique, autologous bone grafting, and recreation of  the prior malunion geometry.  2. Sliding brachioradialis tenotomy.  3. Open reduction and internal fixation, distal ulna fracture with  tension band wire construct.  4. Dorsal sensory branch ulnar nerve neurolysis extensive in nature.  5. Stress radiography.    Discharge Exam: Blood pressure 144/95, pulse 94, temperature 98.3 F (36.8 C), temperature source Oral, resp. rate 18, height 5\' 7"  (1.702 m), weight 118.842 kg (262 lb), last menstrual period 08/21/2013, SpO2 100.00%. Patient is awake, alert and oriented in no acute distress.  Head is atraumatic normocephalic  Chest aspirations are nonlabored equal expansions are noted  Abdomen nontender  Left upper extremity shows that her splint is clean dry and intact the drain is removed without difficulties. Her sensation  is returning she does have a residual effects of her preoperative block still present however she states that she is beginning to move the fingers better and does as though the sensation is returning. She has excellent refill present.  Disposition: 01-Home or Self Care  Discharge Orders   Future Orders Complete By Expires   Call MD / Call 911  As directed    Comments:     If you experience chest pain or shortness of breath, CALL 911 and be transported to the hospital emergency room.  If you develope a fever above 101 F, pus (white drainage) or increased drainage or redness at the wound, or calf pain, call your surgeon's office.   Constipation Prevention  As directed    Comments:     Drink plenty of fluids.  Prune juice may be helpful.  You may use a stool softener, such as Colace (over the counter) 100 mg twice a day.  Use MiraLax (over the counter) for constipation as needed.   Diet - low sodium heart healthy  As directed    Discharge instructions  As directed    Comments:     Marland KitchenMarland KitchenKeep bandage clean and dry.  Call for any problems.  No smoking.  Criteria for driving a car: you should be off your pain medicine for 7-8 hours, able to drive one handed(confident), thinking clearly and feeling able in your judgement to drive. Continue elevation as it will decrease swelling.  If instructed by MD move your fingers within the confines of the bandage/splint.  Use ice if instructed by your MD. Call immediately for any sudden loss of feeling in your hand/arm or change in functional abilities of the extremity. Marland Kitchen.We recommend  that you to take vitamin C 1000 mg a day to promote healing we also recommend that if you require her pain medicine that he take a stool softener to prevent constipation as most pain medicines will have constipation side effects. We recommend either Peri-Colace or Senokot and recommend that you also consider adding MiraLAX to prevent the constipation affects from pain medicine if you are  required to use them. These medicines are over the counter and maybe purchased at a local pharmacy.   Increase activity slowly as tolerated  As directed        Medication List    STOP taking these medications       oxyCODONE-acetaminophen 7.5-325 MG per tablet  Commonly known as:  PERCOCET      TAKE these medications       methocarbamol 500 MG tablet  Commonly known as:  ROBAXIN  Take 1 tablet (500 mg total) by mouth every 6 (six) hours as needed for muscle spasms.     oxyCODONE 5 MG immediate release tablet  Commonly known as:  Oxy IR/ROXICODONE  Take 1-2 tablets (5-10 mg total) by mouth every 3 (three) hours as needed for moderate pain.           Follow-up Information   Follow up with Karen ChafeGRAMIG III,WILLIAM M, MD. Schedule an appointment as soon as possible for a visit in 12 days.   Specialty:  Orthopedic Surgery   Contact information:   [address] [place] [id number] [phone]       Signed: Sheran LawlessBUCHANAN,Maryuri Warnke L 09/20/2013, 12:26 PM

## 2013-09-20 NOTE — Discharge Instructions (Signed)

## 2013-09-22 ENCOUNTER — Encounter (HOSPITAL_COMMUNITY): Payer: Self-pay | Admitting: Orthopedic Surgery
